# Patient Record
Sex: Male | Born: 1995 | Race: Black or African American | Hispanic: No | Marital: Single | Smoking: Never smoker
Health system: Southern US, Community
[De-identification: ages and names within clinical notes are randomized; demographics above are authoritative.]

## PROBLEM LIST (undated history)

## (undated) DIAGNOSIS — A048 Other specified bacterial intestinal infections: Secondary | ICD-10-CM

## (undated) DIAGNOSIS — K2970 Gastritis, unspecified, without bleeding: Secondary | ICD-10-CM

## (undated) DIAGNOSIS — R1110 Vomiting, unspecified: Secondary | ICD-10-CM

## (undated) DIAGNOSIS — R197 Diarrhea, unspecified: Secondary | ICD-10-CM

## (undated) HISTORY — DX: Gastritis, unspecified, without bleeding: K29.70

## (undated) HISTORY — DX: Other specified bacterial intestinal infections: A04.8

## (undated) HISTORY — DX: Diarrhea, unspecified: R19.7

## (undated) HISTORY — DX: Vomiting, unspecified: R11.10

---
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
## 2019-04-09 ENCOUNTER — Encounter (HOSPITAL_COMMUNITY): Payer: Self-pay | Admitting: Emergency Medicine

## 2019-04-09 ENCOUNTER — Emergency Department (HOSPITAL_COMMUNITY)
Admission: EM | Admit: 2019-04-09 | Discharge: 2019-04-09 | Disposition: A | Discharge status: Home / Self Care | Payer: BC Managed Care – PPO | Attending: Emergency Medicine | Admitting: Emergency Medicine

## 2019-04-09 ENCOUNTER — Other Ambulatory Visit: Payer: Self-pay

## 2019-04-09 ENCOUNTER — Emergency Department (HOSPITAL_COMMUNITY): Payer: BC Managed Care – PPO

## 2019-04-09 DIAGNOSIS — S8991XA Unspecified injury of right lower leg, initial encounter: Secondary | ICD-10-CM | POA: Diagnosis present

## 2019-04-09 DIAGNOSIS — Y999 Unspecified external cause status: Secondary | ICD-10-CM | POA: Insufficient documentation

## 2019-04-09 DIAGNOSIS — Y929 Unspecified place or not applicable: Secondary | ICD-10-CM | POA: Insufficient documentation

## 2019-04-09 DIAGNOSIS — Y9355 Activity, bike riding: Secondary | ICD-10-CM | POA: Insufficient documentation

## 2019-04-09 MED ORDER — KETOROLAC TROMETHAMINE 30 MG/ML IJ SOLN
60.0000 mg | Freq: Once | INTRAMUSCULAR | Status: AC
  Administered 2019-04-09: 60 mg via INTRAMUSCULAR
  Filled 2019-04-09: qty 2

## 2019-04-09 MED ORDER — ACETAMINOPHEN 500 MG PO TABS
1000.0000 mg | ORAL_TABLET | Freq: Once | ORAL | Status: AC
  Administered 2019-04-09: 08:00:00 1000 mg via ORAL
  Filled 2019-04-09: qty 2

## 2019-04-09 NOTE — Discharge Instructions (Signed)
You are seen in the ER for right knee injury  X-ray was normal  I suspect you may have sustained a soft tissue injury possibly involving your ligaments, tendons or meniscus  Initial treatment includes rest, ice, elevation, anti-inflammatories and immobilization  For pain and inflammation you can use a combination of ibuprofen and acetaminophen.  Take [phone] mg acetaminophen (tylenol) every 6 hours or 600 mg ibuprofen (advil, motrin) every 6 hours.  You can take these separately or combine them every 6 hours for maximum pain control. Do not exceed 4,000 mg acetaminophen or 2,400 mg ibuprofen in a 24 hour period.  Do not take ibuprofen containing products if you have history of kidney disease, ulcers, GI bleeding, severe acid reflux, or take a blood thinner.  Do not take acetaminophen if you have liver disease.   Wear the knee immobilizer and crutches for the next 7 to 10 days and do not weight-bear on this extremity.  Continue to do this until you are evaluated again by an orthopedist.  On your visit they may discuss other options for imaging including repeat x-rays, CT scans or MRIs to evaluate soft tissue injury.  Elevate your knee, ice at least 3-4 times a day for 20 minutes each time.  Return to the ER for worsening, severe pain redness swelling warmth fevers, calf pain or swelling, purple discoloration or coolness or loss of sensation to your extremity or foot
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
## 2019-04-09 NOTE — Progress Notes (Signed)
Orthopedic Tech Progress Note Patient Details:  Jonathan Gilbert 03-15-1996 [id number]  Ortho Devices Type of Ortho Device: Knee Immobilizer, Crutches Ortho Device/Splint Interventions: Application   Post Interventions Patient Tolerated: Well Instructions Provided: Care of device   [name] 04/09/2019, 8:34 AM

## 2020-06-11 ENCOUNTER — Encounter (HOSPITAL_COMMUNITY): Payer: Self-pay

## 2020-06-11 ENCOUNTER — Other Ambulatory Visit: Payer: Self-pay

## 2020-06-11 ENCOUNTER — Emergency Department (HOSPITAL_COMMUNITY)
Admission: EM | Admit: 2020-06-11 | Discharge: 2020-06-12 | Disposition: A | Discharge status: Home / Self Care | Payer: BC Managed Care – PPO | Attending: Emergency Medicine | Admitting: Emergency Medicine

## 2020-06-11 DIAGNOSIS — H9209 Otalgia, unspecified ear: Secondary | ICD-10-CM | POA: Diagnosis not present

## 2020-06-11 DIAGNOSIS — J029 Acute pharyngitis, unspecified: Secondary | ICD-10-CM | POA: Diagnosis present

## 2020-06-11 DIAGNOSIS — J020 Streptococcal pharyngitis: Secondary | ICD-10-CM | POA: Diagnosis not present

## 2020-06-11 NOTE — ED Triage Notes (Signed)
Patient with L ear pain and sore throat with swollen tonsils, feels like his L neck is swollen, denies fever

## 2020-06-12 LAB — GROUP A STREP BY PCR: Group A Strep by PCR: DETECTED — AB

## 2020-06-12 MED ORDER — DEXAMETHASONE 4 MG PO TABS
10.0000 mg | ORAL_TABLET | Freq: Once | ORAL | Status: AC
  Administered 2020-06-12: 10 mg via ORAL
  Filled 2020-06-12: qty 3

## 2020-06-12 MED ORDER — PENICILLIN G BENZATHINE 1200000 UNIT/2ML IM SUSP
1.2000 10*6.[IU] | Freq: Once | INTRAMUSCULAR | Status: AC
  Administered 2020-06-12: 1.2 10*6.[IU] via INTRAMUSCULAR
  Filled 2020-06-12: qty 2

## 2020-06-12 NOTE — Discharge Instructions (Signed)
We recommend naproxen for pain as well as over-the-counter Chloraseptic spray.  You may have soothing of your throat with drinking warm fluids such as tea with lemon and honey.  You were given an antibiotic prior to discharge and do not require any additional antibiotics for management.  We would recommend follow-up with a primary care doctor to ensure that symptoms are resolving.  Return for new or concerning symptoms.

## 2020-06-12 NOTE — ED Provider Notes (Signed)
[facility] EMERGENCY DEPARTMENT Provider Note   CSN: [id number] Arrival date & time: 06/11/20  2254     History Chief Complaint  Patient presents with  . Sore Throat  . Ear Pain    Jonathan Gilbert is a 25 y.o. male.  The history is provided by the patient. No language interpreter was used.  Sore Throat This is a new problem. Episode onset: 3-4 days ago. The problem occurs constantly. The problem has been gradually worsening. The symptoms are aggravated by swallowing. Nothing relieves the symptoms. The treatment provided no relief.       Past Medical History:  Diagnosis Date  . Diarrhea   . Gastritis   . H. pylori infection    Has History of H Pylori  . Vomiting     Patient Active Problem List   Diagnosis Date Noted  . Gastritis   . Diarrhea   . Vomiting   . H. pylori infection     History reviewed. No pertinent surgical history.     Family History  Problem Relation Age of Onset  . Ulcers Father     Social History   Tobacco Use  . Smoking status: Never Smoker  . Smokeless tobacco: Never Used  Substance Use Topics  . Alcohol use: Yes    Home Medications Prior to Admission medications   Medication Sig Start Date End Date Taking? Authorizing Provider  desloratadine (CLARINEX) 5 MG tablet Take 5 mg by mouth daily.   07/18/10   [provider]  esomeprazole (NEXIUM) 40 MG capsule Take 40 mg by mouth daily before breakfast.   07/18/10   [provider]    Allergies    Patient has no known allergies.  Review of Systems   Review of Systems  Ten systems reviewed and are negative for acute change, except as noted in the HPI.    Physical Exam Updated Vital Signs BP (!) 143/93 (BP Location: Left Arm)   Pulse 72   Temp 98.5 F (36.9 C) (Oral)   Resp 16   Ht 6\' 2"  (1.88 m)   Wt 124.7 kg   SpO2 98%   BMI 35.31 kg/m   Physical Exam Vitals and nursing note reviewed.  Constitutional:      General: He is not in  acute distress.    Appearance: He is well-developed. He is not diaphoretic.     Comments: Nontoxic appearing and in NAD  HENT:     Head: Normocephalic and atraumatic.     Mouth/Throat:     Mouth: Mucous membranes are moist.     Pharynx: Uvula midline. Posterior oropharyngeal erythema present.     Tonsils: No tonsillar abscesses.  Eyes:     General: No scleral icterus.    Conjunctiva/sclera: Conjunctivae normal.  Neck:     Comments: No meningismus Pulmonary:     Effort: Pulmonary effort is normal. No respiratory distress.     Comments: Respirations even and unlabored. No stridor. Musculoskeletal:        General: Normal range of motion.     Cervical back: Normal range of motion.  Lymphadenopathy:     Cervical: Cervical adenopathy present.  Skin:    General: Skin is warm and dry.     Coloration: Skin is not pale.     Findings: No erythema or rash.  Neurological:     Mental Status: He is alert and oriented to person, place, and time.     Coordination: Coordination normal.  Psychiatric:  Behavior: Behavior normal.     ED Results / Procedures / Treatments   Labs (all labs ordered are listed, but only abnormal results are displayed) Labs Reviewed  GROUP A STREP BY PCR - Abnormal; Notable for the following components:      Result Value   Group A Strep by PCR DETECTED (*)    All other components within normal limits    EKG None  Radiology No results found.  Procedures Procedures   Medications Ordered in ED Medications  penicillin g benzathine (BICILLIN LA) 1200000 UNIT/2ML injection 1.2 Million Units (1.2 Million Units Intramuscular Given 06/12/20 0408)  dexamethasone (DECADRON) tablet 10 mg (10 mg Oral Given 06/12/20 0407)    ED Course  I have reviewed the triage vital signs and the nursing notes.  Pertinent labs & imaging results that were available during my care of the patient were reviewed by me and considered in my medical decision making (see chart for  details).    MDM Rules/Calculators/A&P                          Patient with posterior oropharyngeal erythema, cervical lymphadenopathy, and dysphagia; diagnosis of strep. Treated in the ED with steroids and PCN IM. Presentation not concerning for PTA or infxn spread to soft tissue. No trismus or uvula deviation.  Tolerating secretions without difficulty. Return precautions discussed and provided. Patient discharged in stable condition with no unaddressed concerns.   Final Clinical Impression(s) / ED Diagnoses Final diagnoses:  Strep pharyngitis    Rx / DC Orders ED Discharge Orders    None       Antony Madura, PA-C 06/12/20 0419    Glynn Octave, MD 06/12/20 [phone]

## 2021-03-06 IMAGING — CR DG KNEE COMPLETE 4+V*R*
4 series · 4 of 4 positions shown · non-contrast
Comparison: April 20, 2003.

CLINICAL DATA: Right knee pain after fall yesterday.

EXAM:
RIGHT KNEE - COMPLETE 4+ VIEW

[knee ap]
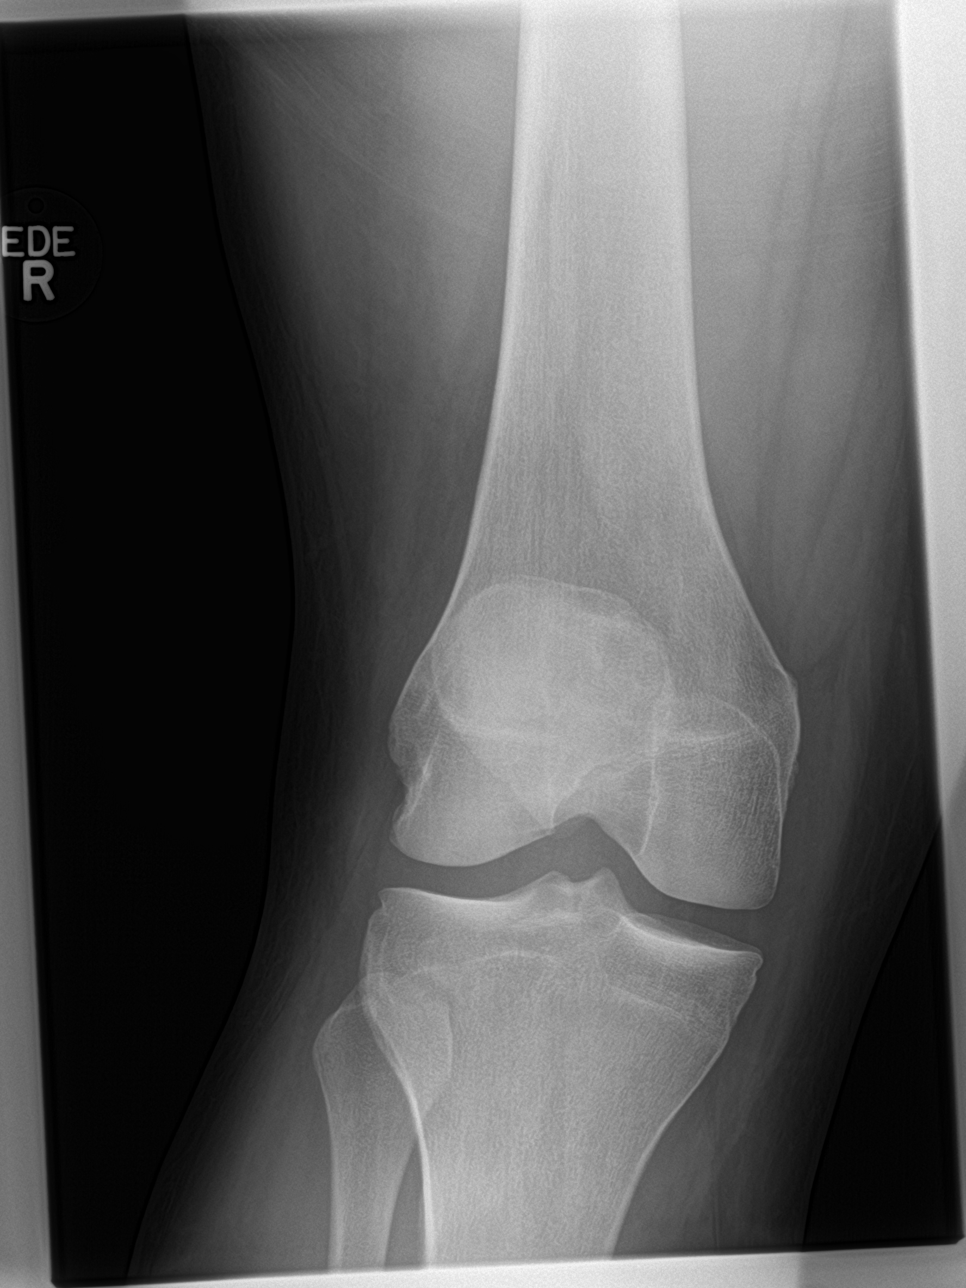

[knee lat]
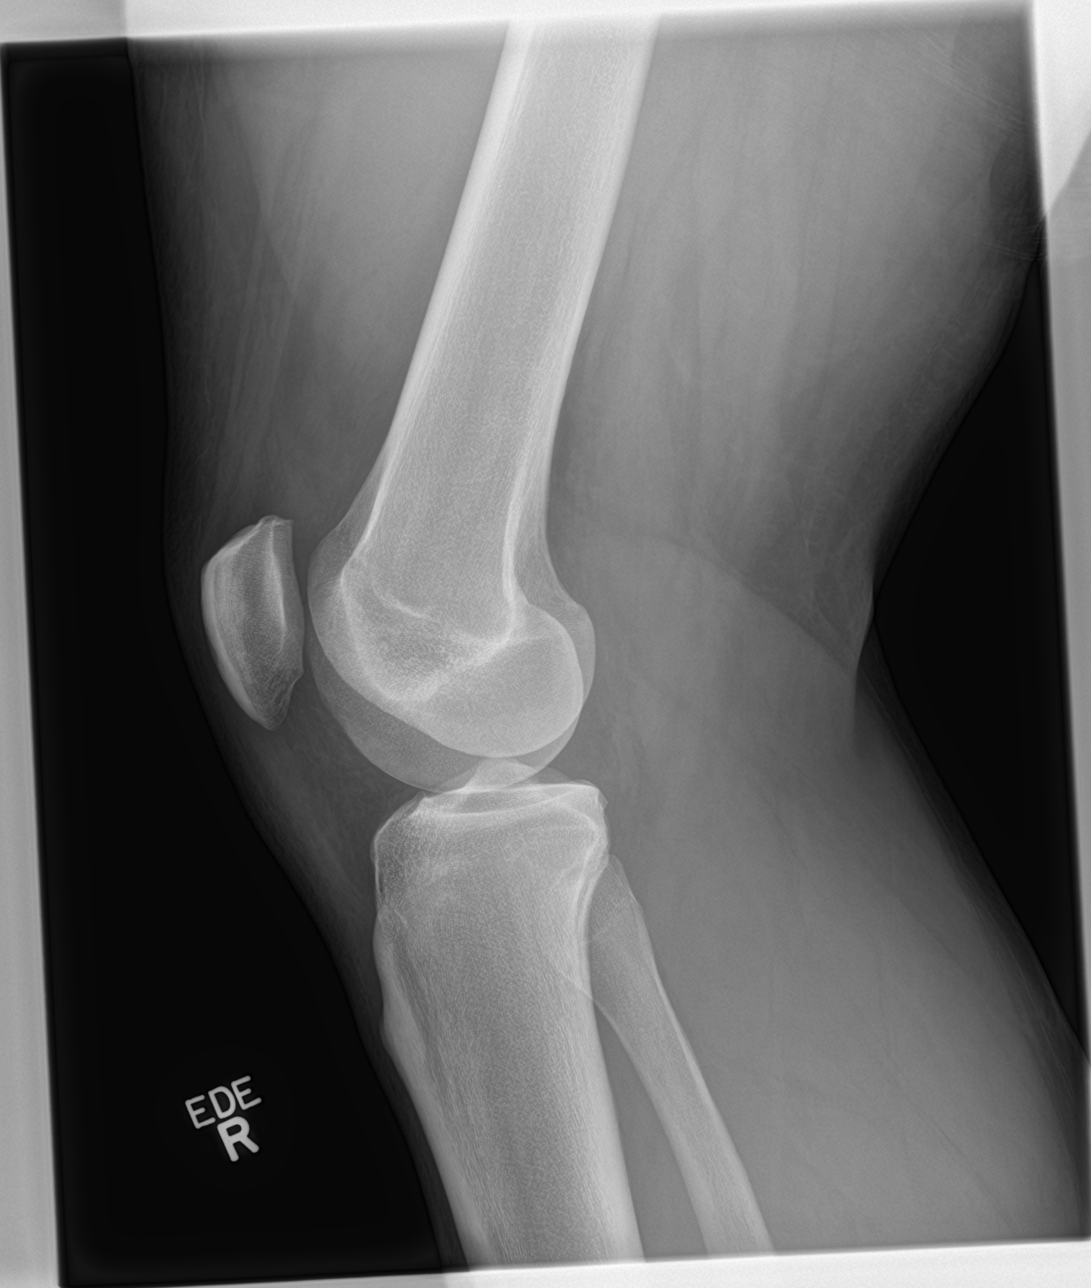

[knee obl (1 of 2)]
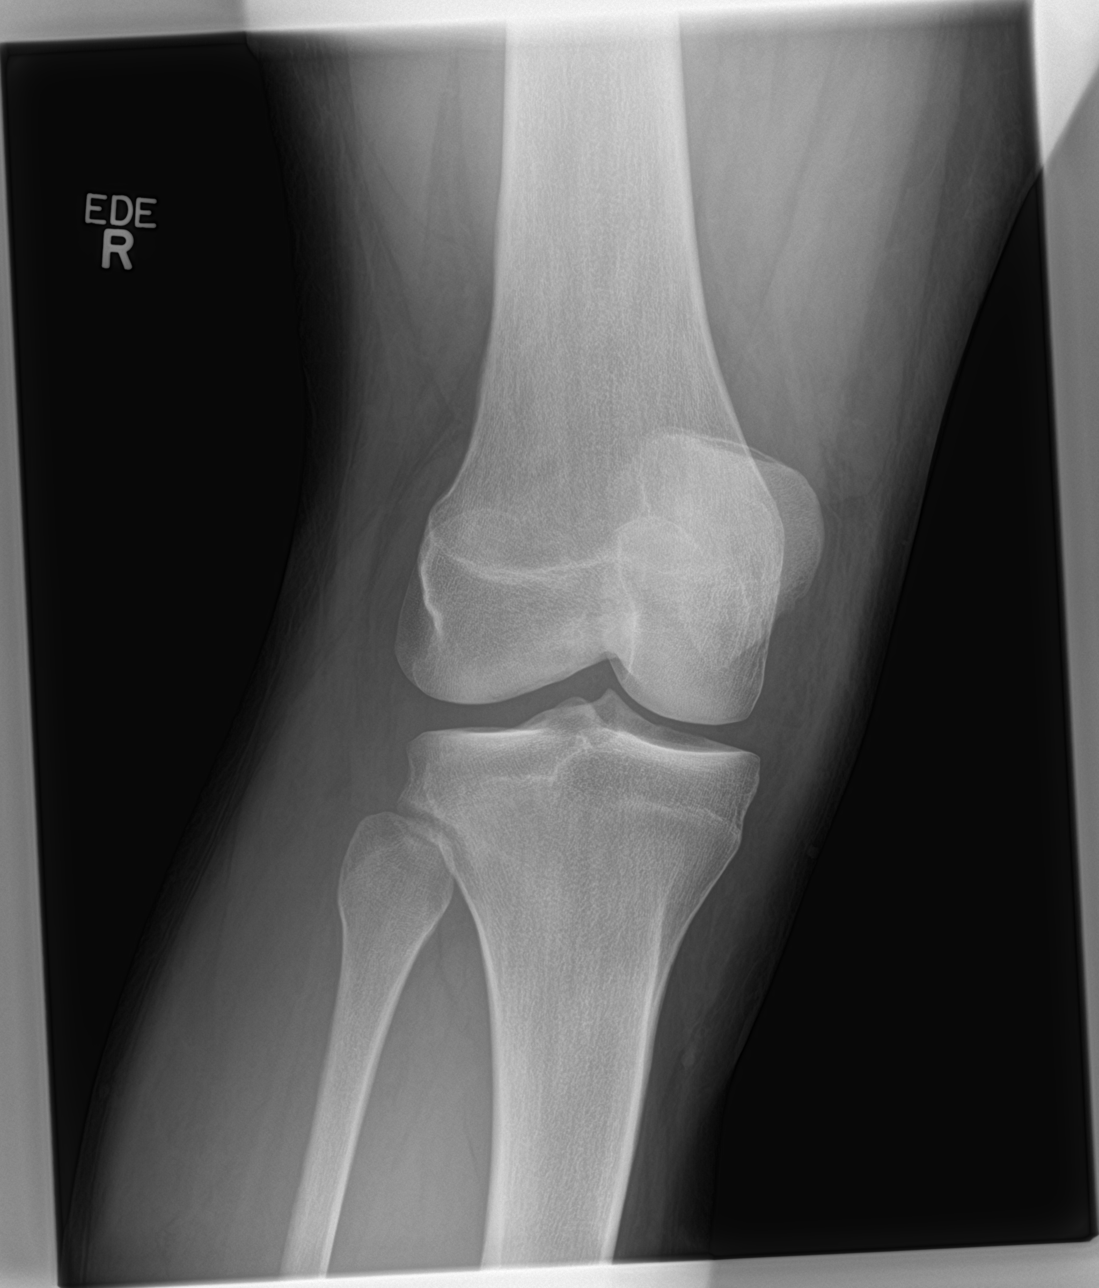

[knee obl (2 of 2)]
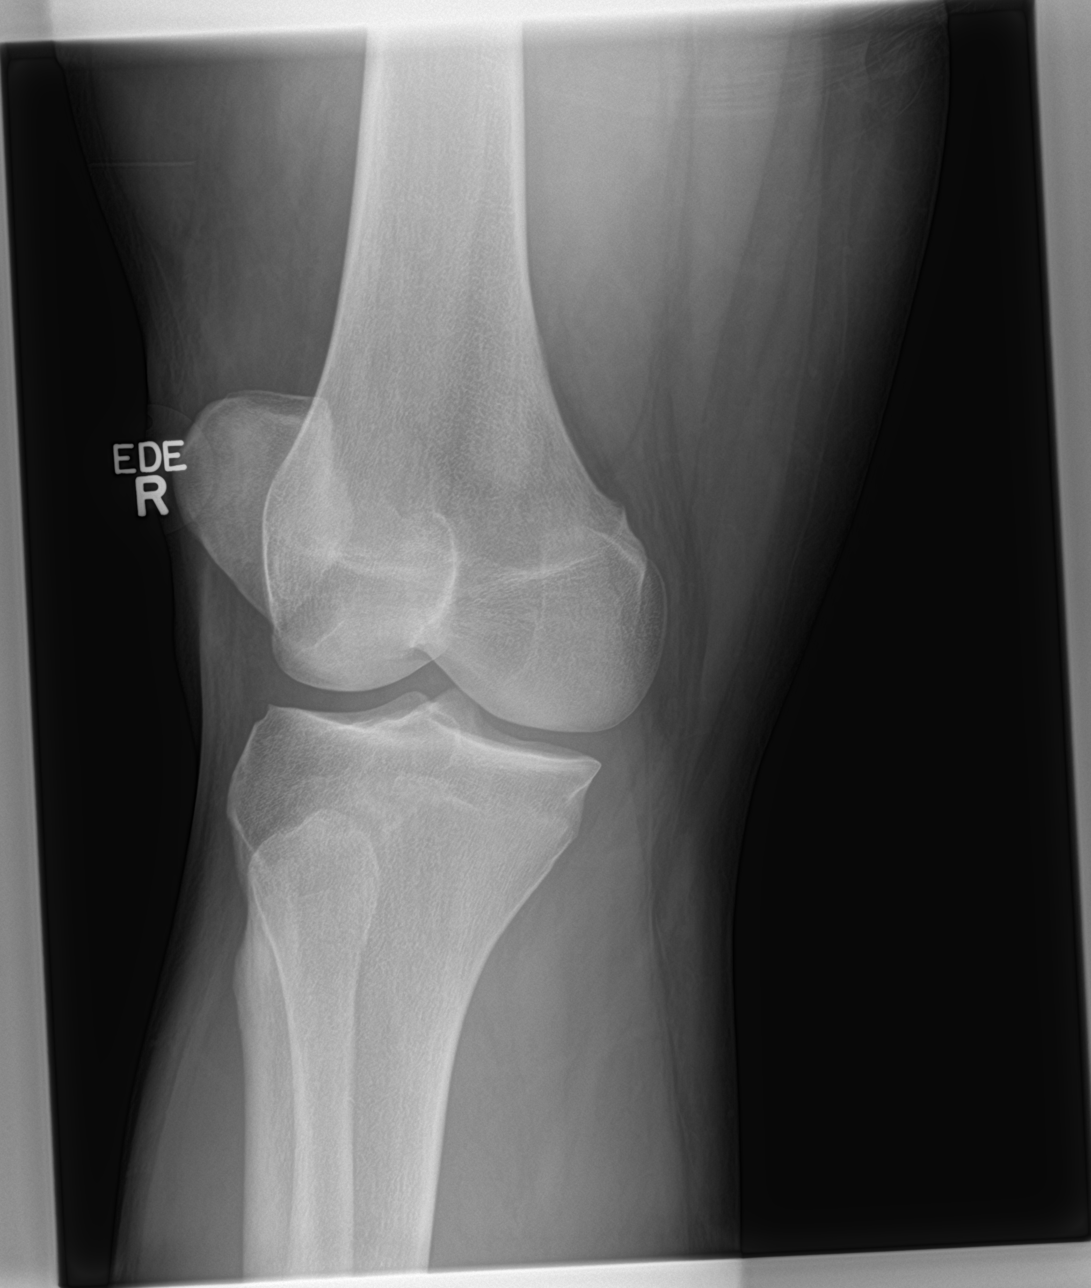

[4 of 4 positions shown; findings below may reference images not displayed]

FINDINGS: No evidence of fracture, dislocation, or joint effusion. No evidence
of arthropathy or other focal bone abnormality. Soft tissues are
unremarkable.
IMPRESSION: Negative.

## 2022-01-23 ENCOUNTER — Other Ambulatory Visit: Payer: Self-pay

## 2022-01-23 ENCOUNTER — Emergency Department (HOSPITAL_BASED_OUTPATIENT_CLINIC_OR_DEPARTMENT_OTHER)
Admission: EM | Admit: 2022-01-23 | Discharge: 2022-01-23 | Disposition: A | Discharge status: Home / Self Care | Payer: 59 | Attending: Emergency Medicine | Admitting: Emergency Medicine

## 2022-01-23 ENCOUNTER — Encounter (HOSPITAL_BASED_OUTPATIENT_CLINIC_OR_DEPARTMENT_OTHER): Payer: Self-pay

## 2022-01-23 DIAGNOSIS — X58XXXA Exposure to other specified factors, initial encounter: Secondary | ICD-10-CM | POA: Insufficient documentation

## 2022-01-23 DIAGNOSIS — S39012A Strain of muscle, fascia and tendon of lower back, initial encounter: Secondary | ICD-10-CM | POA: Insufficient documentation

## 2022-01-23 DIAGNOSIS — S3992XA Unspecified injury of lower back, initial encounter: Secondary | ICD-10-CM | POA: Diagnosis present

## 2022-01-23 MED ORDER — CYCLOBENZAPRINE HCL 10 MG PO TABS: 10.0000 mg | ORAL_TABLET | Freq: Two times a day (BID) | ORAL | 0 refills | Status: DC | PRN

## 2022-01-23 MED ORDER — IBUPROFEN 600 MG PO TABS: 600.0000 mg | ORAL_TABLET | Freq: Four times a day (QID) | ORAL | 0 refills | Status: DC | PRN

## 2022-01-23 NOTE — ED Triage Notes (Signed)
Back pain started 2-3 days ago mid to lower back pain. Took ibuprofen 800 extra strength. New onset.

## 2022-01-23 NOTE — ED Provider Notes (Signed)
[facility] EMERGENCY DEPT Provider Note   CSN: [id number] Arrival date & time: 01/23/22  1214     History  Chief Complaint  Patient presents with   Back Pain    Jonathan Gilbert is a 26 y.o. male.  The history is provided by the patient. No language interpreter was used.  Back Pain    26 year old male here with complaint of back pain.  Patient report dull achy left-sided lower back pain for the last 2 days.  Pain worse when he flexes back and improves when he extends back.  Pain is moderate in severity and improved with IcyHot and rest.  No fever or chills no abdominal pain no nausea vomiting diarrhea no dysuria no hematuria no rash.  Denies any strenuous activities or heavy lifting.  No trouble ambulating.  Home Medications Prior to Admission medications   Medication Sig Start Date End Date Taking? Authorizing Provider  desloratadine (CLARINEX) 5 MG tablet Take 5 mg by mouth daily.   07/18/10   [provider]  esomeprazole (NEXIUM) 40 MG capsule Take 40 mg by mouth daily before breakfast.   07/18/10   [provider]      Allergies    Patient has no known allergies.    Review of Systems   Review of Systems  Musculoskeletal:  Positive for back pain.  All other systems reviewed and are negative.   Physical Exam Updated Vital Signs BP (!) 135/100 (BP Location: Right Arm)   Pulse 60   Temp 98.2 F (36.8 C) (Oral)   Resp 19   Ht 6\' 2"  (1.88 m)   Wt 122.5 kg   SpO2 97%   BMI 34.67 kg/m  Physical Exam Vitals and nursing note reviewed.  Constitutional:      General: He is not in acute distress.    Appearance: He is well-developed.  HENT:     Head: Atraumatic.  Eyes:     Conjunctiva/sclera: Conjunctivae normal.  Abdominal:     Palpations: Abdomen is soft.     Tenderness: There is no abdominal tenderness.  Musculoskeletal:        General: Tenderness (Mild tenderness to left lumbar paraspinal muscle without any overlying skin  changes.  No CVA tenderness.  Abdomen is soft nontender.) present.     Cervical back: Neck supple.     Comments: 5 out of 5 strength to bilateral lower extremities with intact distal pedal pulse  Skin:    Findings: No rash.  Neurological:     Mental Status: He is alert.     ED Results / Procedures / Treatments   Labs (all labs ordered are listed, but only abnormal results are displayed) Labs Reviewed - No data to display  EKG None  Radiology No results found.  Procedures Procedures    Medications Ordered in ED Medications - No data to display  ED Course/ Medical Decision Making/ A&P                           Medical Decision Making  BP (!) 135/100 (BP Location: Right Arm)   Pulse 60   Temp 98.2 F (36.8 C) (Oral)   Resp 19   Ht 6\' 2"  (1.88 m)   Wt 122.5 kg   SpO2 97%   BMI 34.67 kg/m   72:75 PM 26 year old male here with complaint of back pain.  Patient report dull achy left-sided lower back pain for  the last 2 days.  Pain worse when he flexes back and improves when he extends back.  Pain is moderate in severity and improved with IcyHot and rest.  No fever or chills no abdominal pain no nausea vomiting diarrhea no dysuria no hematuria no rash.  Denies any strenuous activities or heavy lifting.  No trouble ambulating.  On exam this is a well-appearing male appears to be in no acute discomfort.  He does have some mild tenderness to his left lumbar paraspinal muscle without any overlying skin changes.  He does not have any CVA tenderness to suggest kidney stone.  Abdomen is soft nontender.  Has equal strength to bilateral lower extremities.  He ambulate without difficulty.  I have considered x-ray of lumbar spine but felt it is low yield given lack of injury and no pain to midline spine.  I have considered labs and CT scan but I felt symptoms more likely MSK and he has a fairly benign abdominal exam.  I have consider prescription medication, patient comfortable with  ibuprofen and Flexeril.  Return precaution given.  Differential diagnosis include muscle strain, spinal stenosis, kidney stone, UTI, shingles, dissection.           Final Clinical Impression(s) / ED Diagnoses Final diagnoses:  Strain of lumbar paraspinous muscle, initial encounter    Rx / DC Orders ED Discharge Orders          Ordered    ibuprofen (ADVIL) 600 MG tablet  Every 6 hours PRN        01/23/22 1523    cyclobenzaprine (FLEXERIL) 10 MG tablet  2 times daily PRN        01/23/22 1523              Domenic Moras, PA-C 01/23/22 1525    Ezequiel Essex, MD 01/23/22 1955

## 2023-01-31 ENCOUNTER — Encounter (HOSPITAL_COMMUNITY): Payer: Self-pay | Admitting: Radiology

## 2023-01-31 ENCOUNTER — Other Ambulatory Visit: Payer: Self-pay

## 2023-01-31 ENCOUNTER — Inpatient Hospital Stay (HOSPITAL_COMMUNITY)
Admission: EM | Admit: 2023-01-31 | Discharge: 2023-02-04 | DRG: 176 | Disposition: A | Discharge status: Home / Self Care | Payer: Self-pay | Attending: Internal Medicine | Admitting: Internal Medicine

## 2023-01-31 ENCOUNTER — Emergency Department (HOSPITAL_COMMUNITY): Payer: Self-pay

## 2023-01-31 DIAGNOSIS — I2699 Other pulmonary embolism without acute cor pulmonale: Principal | ICD-10-CM | POA: Diagnosis present

## 2023-01-31 DIAGNOSIS — K2970 Gastritis, unspecified, without bleeding: Secondary | ICD-10-CM | POA: Diagnosis present

## 2023-01-31 DIAGNOSIS — E66812 Obesity, class 2: Secondary | ICD-10-CM | POA: Diagnosis present

## 2023-01-31 DIAGNOSIS — Z79899 Other long term (current) drug therapy: Secondary | ICD-10-CM

## 2023-01-31 DIAGNOSIS — F1290 Cannabis use, unspecified, uncomplicated: Secondary | ICD-10-CM | POA: Diagnosis present

## 2023-01-31 DIAGNOSIS — Z86718 Personal history of other venous thrombosis and embolism: Secondary | ICD-10-CM

## 2023-01-31 DIAGNOSIS — K219 Gastro-esophageal reflux disease without esophagitis: Secondary | ICD-10-CM | POA: Diagnosis present

## 2023-01-31 DIAGNOSIS — I2694 Multiple subsegmental pulmonary emboli without acute cor pulmonale: Principal | ICD-10-CM | POA: Diagnosis present

## 2023-01-31 DIAGNOSIS — Z7151 Drug abuse counseling and surveillance of drug abuser: Secondary | ICD-10-CM

## 2023-01-31 DIAGNOSIS — Z713 Dietary counseling and surveillance: Secondary | ICD-10-CM

## 2023-01-31 DIAGNOSIS — Z6835 Body mass index (BMI) 35.0-35.9, adult: Secondary | ICD-10-CM

## 2023-01-31 LAB — CBC
HCT: 46.1 % (ref 39.0–52.0)
Hemoglobin: 15.7 g/dL (ref 13.0–17.0)
MCH: 31.2 pg (ref 26.0–34.0)
MCHC: 34.1 g/dL (ref 30.0–36.0)
MCV: 91.5 fL (ref 80.0–100.0)
Platelets: 189 10*3/uL (ref 150–400)
RBC: 5.04 MIL/uL (ref 4.22–5.81)
RDW: 12.1 % (ref 11.5–15.5)
WBC: 10.7 10*3/uL — ABNORMAL HIGH (ref 4.0–10.5)
nRBC: 0 % (ref 0.0–0.2)

## 2023-01-31 LAB — BASIC METABOLIC PANEL
Anion gap: 11 (ref 5–15)
BUN: 9 mg/dL (ref 6–20)
CO2: 23 mmol/L (ref 22–32)
Calcium: 9.1 mg/dL (ref 8.9–10.3)
Chloride: 103 mmol/L (ref 98–111)
Creatinine, Ser: 1.05 mg/dL (ref 0.61–1.24)
GFR, Estimated: >60 mL/min (ref >60)
Glucose, Bld: 98 mg/dL (ref 70–99)
Potassium: 4.1 mmol/L (ref 3.5–5.1)
Sodium: 137 mmol/L (ref 135–145)

## 2023-01-31 LAB — TROPONIN I (HIGH SENSITIVITY): Troponin I (High Sensitivity): 2 ng/L (ref <18)

## 2023-01-31 NOTE — ED Triage Notes (Signed)
Pt states chest pain x2 days in the left chest then around to his back and today up his neck and down arm today. Pain is reproducible.  91 140/90 98%

## 2023-02-01 ENCOUNTER — Observation Stay (HOSPITAL_COMMUNITY): Payer: Self-pay

## 2023-02-01 ENCOUNTER — Inpatient Hospital Stay (HOSPITAL_COMMUNITY): Payer: Self-pay

## 2023-02-01 ENCOUNTER — Other Ambulatory Visit (HOSPITAL_COMMUNITY): Payer: Self-pay

## 2023-02-01 ENCOUNTER — Emergency Department (HOSPITAL_COMMUNITY): Payer: Self-pay

## 2023-02-01 ENCOUNTER — Other Ambulatory Visit (HOSPITAL_COMMUNITY): Payer: Self-pay | Admitting: *Deleted

## 2023-02-01 DIAGNOSIS — E66812 Obesity, class 2: Secondary | ICD-10-CM | POA: Diagnosis present

## 2023-02-01 DIAGNOSIS — I2609 Other pulmonary embolism with acute cor pulmonale: Secondary | ICD-10-CM

## 2023-02-01 DIAGNOSIS — K2950 Unspecified chronic gastritis without bleeding: Secondary | ICD-10-CM

## 2023-02-01 DIAGNOSIS — I2699 Other pulmonary embolism without acute cor pulmonale: Secondary | ICD-10-CM | POA: Diagnosis present

## 2023-02-01 LAB — CBC
HCT: 40.3 % (ref 39.0–52.0)
Hemoglobin: 14.3 g/dL (ref 13.0–17.0)
MCH: 32.1 pg (ref 26.0–34.0)
MCHC: 35.5 g/dL (ref 30.0–36.0)
MCV: 90.6 fL (ref 80.0–100.0)
Platelets: 161 10*3/uL (ref 150–400)
RBC: 4.45 MIL/uL (ref 4.22–5.81)
RDW: 12 % (ref 11.5–15.5)
WBC: 11.1 10*3/uL — ABNORMAL HIGH (ref 4.0–10.5)
nRBC: 0 % (ref 0.0–0.2)

## 2023-02-01 LAB — ECHOCARDIOGRAM COMPLETE
AR max vel: 2.97 cm2
AV Area VTI: 3.19 cm2
AV Area mean vel: 3.31 cm2
AV Mean grad: 6 mm[Hg]
AV Peak grad: 12 mm[Hg]
Ao pk vel: 1.73 m/s
Area-P 1/2: 3.31 cm2
Height: 74 in
S' Lateral: 3.1 cm
Weight: 4480.1 [oz_av]

## 2023-02-01 LAB — HEPATIC FUNCTION PANEL
ALT: 185 U/L — ABNORMAL HIGH (ref 0–44)
AST: 414 U/L — ABNORMAL HIGH (ref 15–41)
Albumin: 4.4 g/dL (ref 3.5–5.0)
Alkaline Phosphatase: 88 U/L (ref 38–126)
Bilirubin, Direct: 0.7 mg/dL — ABNORMAL HIGH (ref 0.0–0.2)
Indirect Bilirubin: 1.6 mg/dL — ABNORMAL HIGH (ref 0.3–0.9)
Total Bilirubin: 2.3 mg/dL — ABNORMAL HIGH (ref <1.2)
Total Protein: 7.6 g/dL (ref 6.5–8.1)

## 2023-02-01 LAB — HIV ANTIBODY (ROUTINE TESTING W REFLEX): HIV Screen 4th Generation wRfx: NONREACTIVE

## 2023-02-01 LAB — HEPARIN LEVEL (UNFRACTIONATED)
Heparin Unfractionated: <0.1 [IU]/mL — ABNORMAL LOW (ref 0.30–0.70)
Heparin Unfractionated: 0.81 [IU]/mL — ABNORMAL HIGH (ref 0.30–0.70)

## 2023-02-01 LAB — MAGNESIUM: Magnesium: 2.1 mg/dL (ref 1.7–2.4)

## 2023-02-01 LAB — TROPONIN I (HIGH SENSITIVITY): Troponin I (High Sensitivity): 2 ng/L (ref <18)

## 2023-02-01 LAB — TSH: TSH: 2.005 u[IU]/mL (ref 0.350–4.500)

## 2023-02-01 MED ORDER — ACETAMINOPHEN 650 MG RE SUPP: 650.0000 mg | Freq: Four times a day (QID) | RECTAL | Status: DC | PRN

## 2023-02-01 MED ORDER — POLYETHYLENE GLYCOL 3350 17 G PO PACK: 17.0000 g | PACK | Freq: Every day | ORAL | Status: DC | PRN

## 2023-02-01 MED ORDER — HYDROMORPHONE HCL 1 MG/ML IJ SOLN
1.0000 mg | INTRAMUSCULAR | Status: DC | PRN
  Administered 2023-02-01: 1 mg via INTRAVENOUS
  Filled 2023-02-01: qty 1

## 2023-02-01 MED ORDER — OXYCODONE HCL 5 MG PO TABS
5.0000 mg | ORAL_TABLET | Freq: Four times a day (QID) | ORAL | Status: DC | PRN
  Administered 2023-02-01 – 2023-02-03 (×2): 5 mg via ORAL
  Filled 2023-02-01 (×2): qty 1

## 2023-02-01 MED ORDER — ONDANSETRON HCL 4 MG/2ML IJ SOLN
4.0000 mg | Freq: Once | INTRAMUSCULAR | Status: AC
Start: 2023-02-01 — End: 2023-02-01
  Administered 2023-02-01: 4 mg via INTRAVENOUS
  Filled 2023-02-01: qty 2

## 2023-02-01 MED ORDER — HYDROMORPHONE HCL 1 MG/ML IJ SOLN
INTRAMUSCULAR | Status: AC
  Administered 2023-02-01: 1 mg via INTRAVENOUS
  Filled 2023-02-01: qty 1

## 2023-02-01 MED ORDER — HEPARIN BOLUS VIA INFUSION
3000.0000 [IU] | Freq: Once | INTRAVENOUS | Status: AC
  Administered 2023-02-01: 3000 [IU] via INTRAVENOUS

## 2023-02-01 MED ORDER — NICOTINE 21 MG/24HR TD PT24: 21.0000 mg | MEDICATED_PATCH | Freq: Every day | TRANSDERMAL | Status: DC

## 2023-02-01 MED ORDER — SODIUM CHLORIDE 0.9 % IV BOLUS
1000.0000 mL | Freq: Once | INTRAVENOUS | Status: AC
Start: 2023-02-01 — End: 2023-02-01
  Administered 2023-02-01: 1000 mL via INTRAVENOUS

## 2023-02-01 MED ORDER — ACETAMINOPHEN 325 MG PO TABS
650.0000 mg | ORAL_TABLET | Freq: Four times a day (QID) | ORAL | Status: DC | PRN
  Administered 2023-02-01 – 2023-02-03 (×3): 650 mg via ORAL
  Filled 2023-02-01 (×3): qty 2

## 2023-02-01 MED ORDER — HEPARIN (PORCINE) 25000 UT/250ML-% IV SOLN
2000.0000 [IU]/h | INTRAVENOUS | Status: DC
  Administered 2023-02-01: 1800 [IU]/h via INTRAVENOUS
  Administered 2023-02-02: 2000 [IU]/h via INTRAVENOUS
  Filled 2023-02-01 (×3): qty 250

## 2023-02-01 MED ORDER — MORPHINE SULFATE (PF) 4 MG/ML IV SOLN
4.0000 mg | Freq: Once | INTRAVENOUS | Status: AC
Start: 2023-02-01 — End: 2023-02-01
  Administered 2023-02-01: 4 mg via INTRAVENOUS
  Filled 2023-02-01: qty 1

## 2023-02-01 MED ORDER — PANTOPRAZOLE SODIUM 40 MG PO TBEC
40.0000 mg | DELAYED_RELEASE_TABLET | Freq: Every day | ORAL | Status: DC
  Administered 2023-02-01 – 2023-02-04 (×4): 40 mg via ORAL
  Filled 2023-02-01 (×4): qty 1

## 2023-02-01 MED ORDER — ONDANSETRON HCL 4 MG/2ML IJ SOLN: 4.0000 mg | Freq: Four times a day (QID) | INTRAMUSCULAR | Status: DC | PRN

## 2023-02-01 MED ORDER — ONDANSETRON HCL 4 MG PO TABS: 4.0000 mg | ORAL_TABLET | Freq: Four times a day (QID) | ORAL | Status: DC | PRN

## 2023-02-01 MED ORDER — KETOROLAC TROMETHAMINE 30 MG/ML IJ SOLN
30.0000 mg | Freq: Once | INTRAMUSCULAR | Status: AC
Start: 2023-02-01 — End: 2023-02-01
  Administered 2023-02-01: 30 mg via INTRAVENOUS
  Filled 2023-02-01: qty 1

## 2023-02-01 MED ORDER — PERFLUTREN LIPID MICROSPHERE
1.0000 mL | INTRAVENOUS | Status: AC | PRN
  Administered 2023-02-01: 3 mL via INTRAVENOUS

## 2023-02-01 MED ORDER — ORAL CARE MOUTH RINSE: 15.0000 mL | OROMUCOSAL | Status: DC | PRN

## 2023-02-01 MED ORDER — HYDROMORPHONE HCL 1 MG/ML IJ SOLN
1.0000 mg | INTRAMUSCULAR | Status: DC | PRN
  Administered 2023-02-01 – 2023-02-04 (×14): 1 mg via INTRAVENOUS
  Filled 2023-02-01 (×15): qty 1

## 2023-02-01 MED ORDER — IOHEXOL 350 MG/ML SOLN
100.0000 mL | Freq: Once | INTRAVENOUS | Status: AC | PRN
  Administered 2023-02-01: 100 mL via INTRAVENOUS

## 2023-02-01 MED ORDER — HEPARIN BOLUS VIA INFUSION
6000.0000 [IU] | Freq: Once | INTRAVENOUS | Status: AC
  Administered 2023-02-01: 6000 [IU] via INTRAVENOUS

## 2023-02-01 NOTE — Plan of Care (Signed)
  Problem: Education: Goal: Knowledge of General Education information will improve Description: Including pain rating scale, medication(s)/side effects and non-pharmacologic comfort measures Outcome: Progressing   Problem: Activity: Goal: Risk for activity intolerance will decrease Outcome: Not Progressing   Problem: Nutrition: Goal: Adequate nutrition will be maintained Outcome: Progressing

## 2023-02-01 NOTE — Assessment & Plan Note (Signed)
-  Appears to be unprovoked event -Patient expressed no recent long travels, orthopedic surgery, immobility, intravenous drug use or COVID vaccination. -He reported some family history of blood clots -2D echo and lower extremity Doppler has been ordered -Planning 24-48 hours of IV heparin with subsequent transition to Eliquis. -As needed analgesics will be provided for pleuritic chest pain -Good saturation on room air and normal vital signs otherwise appreciated.

## 2023-02-01 NOTE — Progress Notes (Signed)
PHARMACY - ANTICOAGULATION CONSULT NOTE  Pharmacy Consult for heparin Indication: pulmonary embolus  No Known Allergies  Patient Measurements: Height: 6\' 2"  (188 cm) Weight: 127 kg (280 lb) IBW/kg (Calculated) : 82.2 Heparin Dosing Weight: 110 kg  Vital Signs: Temp: 100.1 F (37.8 C) (11/07 2025) Temp Source: Oral (11/07 2025) BP: 127/91 (11/08 0230) Pulse Rate: 76 (11/08 0230)  Labs: Recent Labs    01/31/23 2103 02/01/23 0002  HGB 15.7  --   HCT 46.1  --   PLT 189  --   CREATININE 1.05  --   TROPONINIHS 2 2    Estimated Creatinine Clearance: 150.9 mL/min (by C-G formula based on SCr of 1.05 mg/dL).   Medical History: Past Medical History:  Diagnosis Date   Diarrhea    Gastritis    H. pylori infection    Has History of H Pylori   Vomiting    Assessment: 54 yoM presented to [facility] ED with complaints of chest pain. Pharmacy consulted to dose heparin for pulmonary embolism.  -CTA shows bilateral segmental and subsegmental emboli, no RHS -Hgb 15.7, plts 189, no prior anticoagulation   Goal of Therapy:  Heparin level 0.3-0.7 units/ml Monitor platelets by anticoagulation protocol: Yes   Plan:  Give 6000 units bolus x 1 Start heparin infusion at 1800 units/hr Check anti-Xa level in 6 hours and daily while on heparin  Arabella Merles, PharmD. Clinical Pharmacist 02/01/2023 3:01 AM

## 2023-02-01 NOTE — ED Provider Notes (Signed)
[place] EMERGENCY DEPARTMENT AT [facility] Provider Note   CSN: [id number] Arrival date & time: 01/31/23  2020     History  Chief Complaint  Patient presents with   Chest Pain    Markael Hyder III is a 27 y.o. male.  Is a 27 year old male with history of GERD.  Patient presenting today for evaluation of chest pain.  This has been ongoing for the past 2 days.  He describes sharp pain starting from his clavicle and radiating into his left back.  This is worse when he moves.  He also describes pain with coughing.  No fevers or chills.  No productive cough.  No alleviating factors.  The history is provided by the patient.       Home Medications Prior to Admission medications   Medication Sig Start Date End Date Taking? Authorizing Provider  cyclobenzaprine (FLEXERIL) 10 MG tablet Take 1 tablet (10 mg total) by mouth 2 (two) times daily as needed for muscle spasms. 01/23/22   Fayrene Helper, PA-C  desloratadine (CLARINEX) 5 MG tablet Take 5 mg by mouth daily.   07/18/10   [provider]  esomeprazole (NEXIUM) 40 MG capsule Take 40 mg by mouth daily before breakfast.   07/18/10   [provider]  ibuprofen (ADVIL) 600 MG tablet Take 1 tablet (600 mg total) by mouth every 6 (six) hours as needed. 01/23/22   Fayrene Helper, PA-C      Allergies    Patient has no known allergies.    Review of Systems   Review of Systems  All other systems reviewed and are negative.   Physical Exam Updated Vital Signs BP (!) 150/96   Pulse 84   Temp 100.1 F (37.8 C) (Oral)   Resp 19   Ht 6\' 2"  (1.88 m)   Wt 127 kg   SpO2 98%   BMI 35.95 kg/m  Physical Exam Vitals and nursing note reviewed.  Constitutional:      General: He is not in acute distress.    Appearance: He is well-developed. He is not diaphoretic.  HENT:     Head: Normocephalic and atraumatic.  Cardiovascular:     Rate and Rhythm: Normal rate and regular rhythm.     Heart sounds: No murmur  heard.    No friction rub.  Pulmonary:     Effort: Pulmonary effort is normal. No respiratory distress.     Breath sounds: Normal breath sounds. No wheezing or rales.  Chest:     Chest wall: Tenderness present.     Comments: There is tenderness noted to the left upper chest and left lateral chest.  There is no crepitus. Abdominal:     General: Bowel sounds are normal. There is no distension.     Palpations: Abdomen is soft.     Tenderness: There is no abdominal tenderness.  Musculoskeletal:        General: Normal range of motion.     Cervical back: Normal range of motion and neck supple.  Skin:    General: Skin is warm and dry.  Neurological:     Mental Status: He is alert and oriented to person, place, and time.     Coordination: Coordination normal.     ED Results / Procedures / Treatments   Labs (all labs ordered are listed, but only abnormal results are displayed) Labs Reviewed  CBC - Abnormal; Notable for the following components:      Result Value   WBC  10.7 (*)    All other components within normal limits  BASIC METABOLIC PANEL  TROPONIN I (HIGH SENSITIVITY)  TROPONIN I (HIGH SENSITIVITY)    EKG EKG Interpretation Date/Time:  Thursday January 31 2023 20:29:27 EST Ventricular Rate:  78 PR Interval:  154 QRS Duration:  88 QT Interval:  338 QTC Calculation: 385 R Axis:   58  Text Interpretation: Normal sinus rhythm Cannot rule out Inferior infarct , age undetermined Abnormal ECG No previous ECGs available Confirmed by Jacalyn Lefevre [phone]) on 01/31/2023 9:47:02 PM  Radiology DG Chest 2 View  Result Date: 01/31/2023 CLINICAL DATA:  Chest pain and discomfort for 2 days, left arm numbness EXAM: CHEST - 2 VIEW COMPARISON:  None Available. FINDINGS: Frontal and lateral views of the chest demonstrate an unremarkable cardiac silhouette. Lung volumes are diminished, with crowding the pulmonary vasculature. Patchy consolidation at the lung bases favors atelectasis. No  effusion or pneumothorax. No acute bony abnormalities. IMPRESSION: 1. Low lung volumes, with crowding of the pulmonary vasculature and patchy bibasilar consolidation consistent with atelectasis. Electronically Signed   By: Sharlet Salina M.D.   On: 01/31/2023 22:38    Procedures Procedures    Medications Ordered in ED Medications  sodium chloride 0.9 % bolus 1,000 mL (has no administration in time range)  ketorolac (TORADOL) 30 MG/ML injection 30 mg (has no administration in time range)    ED Course/ Medical Decision Making/ A&P  Patient is a 27 year old male otherwise healthy presenting with complaints of chest pain.  He describes pain starting from his left clavicle and radiating into his back that is worse with movement, coughing, or breathing.  Patient arrives with stable vital signs and with no hypoxia.  He does have a low-grade temp of 100.1.  Laboratory studies obtained including CBC, metabolic panel, troponin, all of which are unremarkable.  CTA of the chest obtained showing bilateral segmental and subsegmental pulmonary emboli, but no right heart strain.  Patient has been started on heparin.  He is also received Toradol and morphine for pain.  Patient to be admitted to the hospitalist service for anticoagulation and further management.  Final Clinical Impression(s) / ED Diagnoses Final diagnoses:  None    Rx / DC Orders ED Discharge Orders     None         Geoffery Lyons, MD 02/01/23 [phone]

## 2023-02-01 NOTE — TOC Progression Note (Signed)
Transition of Care [facility]) - Progression Note    Patient Details  Name: Jonathan Gilbert MRN: [id number] Date of Birth: 1995/12/24  Transition of Care [facility]) CM/SW Contact  Geni Bers, RN Phone Number: 02/01/2023, 2:28 PM  Clinical Narrative:    Spoke with pt's mother [name] at beside. Mother asked for information on finances for pt. TOC do not do finances. Explained what TOC does. Pt may need with medication assistant and a PCP.         Expected Discharge Plan and Services                                               Social Determinants of Health (SDOH) Interventions SDOH Screenings   Food Insecurity: No Food Insecurity (02/01/2023)  Housing: Low Risk  (02/01/2023)  Transportation Needs: No Transportation Needs (02/01/2023)  Utilities: Not At Risk (02/01/2023)  Tobacco Use: Low Risk  (01/31/2023)    Readmission Risk Interventions     No data to display

## 2023-02-01 NOTE — ED Notes (Signed)
ED TO INPATIENT HANDOFF REPORT  ED Nurse Name and Phone #: [phone]  S Name/Age/Gender Burman Blacksmith III 27 y.o. male Room/Bed: APA18/APA18  Code Status   Code Status: Full Code  Home/SNF/Other Home Patient oriented to: self, place, time, and situation Is this baseline? Yes   Triage Complete: Triage complete  Chief Complaint Acute pulmonary embolism (HCC) [I26.99]  Triage Note Pt states chest pain x2 days in the left chest then around to his back and today up his neck and down arm today. Pain is reproducible.  91 140/90 98%   Allergies No Known Allergies  Level of Care/Admitting Diagnosis ED Disposition     ED Disposition  Admit   Condition  --   Comment  Hospital Area: [facility] [[id number]]  Level of Care: Telemetry [5]  Covid Evaluation: Asymptomatic - no recent exposure (last 10 days) testing not required  Diagnosis: Acute pulmonary embolism [facility]) [102725]  Admitting Physician: Lilyan Gilford [[id number]]  Attending Physician: Lilyan Gilford [[id number]]          B Medical/Surgery History Past Medical History:  Diagnosis Date   Diarrhea    Gastritis    H. pylori infection    Has History of H Pylori   Vomiting    History reviewed. No pertinent surgical history.   A IV Location/Drains/Wounds Patient Lines/Drains/Airways Status     Active Line/Drains/Airways     Name Placement date Placement time Site Days   Peripheral IV 02/01/23 20 G Anterior;Distal;Right;Upper Arm 02/01/23  0043  Arm  less than 1            Intake/Output Last 24 hours  Intake/Output Summary (Last 24 hours) at 02/01/2023 1206 Last data filed at 02/01/2023 0737 Gross per 24 hour  Intake 1240.17 ml  Output --  Net 1240.17 ml    Labs/Imaging Results for orders placed or performed during the hospital encounter of 01/31/23 (from the past 48 hour(s))  Basic metabolic panel     Status: None   Collection Time: 01/31/23  9:03 PM  Result Value  Ref Range   Sodium 137 135 - 145 mmol/L   Potassium 4.1 3.5 - 5.1 mmol/L   Chloride 103 98 - 111 mmol/L   CO2 23 22 - 32 mmol/L   Glucose, Bld 98 70 - 99 mg/dL    Comment: Glucose reference range applies only to samples taken after fasting for at least 8 hours.   BUN 9 6 - 20 mg/dL   Creatinine, Ser 6.38 0.61 - 1.24 mg/dL   Calcium 9.1 8.9 - 75.6 mg/dL   GFR, Estimated >43 >32 mL/min    Comment: (NOTE) Calculated using the CKD-EPI Creatinine Equation (2021)    Anion gap 11 5 - 15    Comment: Performed at [facility], [address], [place], [place] [address]  CBC     Status: Abnormal   Collection Time: 01/31/23  9:03 PM  Result Value Ref Range   WBC 10.7 (H) 4.0 - 10.5 K/uL   RBC 5.04 4.22 - 5.81 MIL/uL   Hemoglobin 15.7 13.0 - 17.0 g/dL   HCT 41.6 60.6 - 30.1 %   MCV 91.5 80.0 - 100.0 fL   MCH 31.2 26.0 - 34.0 pg   MCHC 34.1 30.0 - 36.0 g/dL   RDW 60.1 09.3 - 23.5 %   Platelets 189 150 - 400 K/uL   nRBC 0.0 0.0 - 0.2 %    Comment: Performed at [facility], [address], [place], [place] [address]  Troponin I (High Sensitivity)     Status: None   Collection Time: 01/31/23  9:03 PM  Result Value Ref Range   Troponin I (High Sensitivity) 2 <18 ng/L    Comment: (NOTE) Elevated high sensitivity troponin I (hsTnI) values and significant  changes across serial measurements may suggest ACS but many other  chronic and acute conditions are known to elevate hsTnI results.  Refer to the "Links" section for chest pain algorithms and additional  guidance. Performed at [facility], [address], [place], [place] [address]   Troponin I (High Sensitivity)     Status: None   Collection Time: 02/01/23 12:02 AM  Result Value Ref Range   Troponin I (High Sensitivity) 2 <18 ng/L    Comment: (NOTE) Elevated high sensitivity troponin I (hsTnI) values and significant  changes across serial measurements may suggest ACS but many other  chronic and acute conditions are known to elevate  hsTnI results.  Refer to the "Links" section for chest pain algorithms and additional  guidance. Performed at [facility], [address], [place], [place] [address]   CBC     Status: Abnormal   Collection Time: 02/01/23  3:27 AM  Result Value Ref Range   WBC 11.1 (H) 4.0 - 10.5 K/uL   RBC 4.45 4.22 - 5.81 MIL/uL   Hemoglobin 14.3 13.0 - 17.0 g/dL   HCT 01.0 27.2 - 53.6 %   MCV 90.6 80.0 - 100.0 fL   MCH 32.1 26.0 - 34.0 pg   MCHC 35.5 30.0 - 36.0 g/dL   RDW 64.4 03.4 - 74.2 %   Platelets 161 150 - 400 K/uL   nRBC 0.0 0.0 - 0.2 %    Comment: Performed at [facility], [address] [place], [place] [address]  Heparin level (unfractionated)     Status: Abnormal   Collection Time: 02/01/23  9:14 AM  Result Value Ref Range   Heparin Unfractionated <0.10 (L) 0.30 - 0.70 IU/mL    Comment: (NOTE) The clinical reportable range upper limit is being lowered to >1.10 to align with the FDA approved guidance for the current laboratory assay.  If heparin results are below expected values, and patient dosage has  been confirmed, suggest follow up testing of antithrombin III levels. Performed at [facility], [address] [place], [place] [address]   TSH     Status: None   Collection Time: 02/01/23  9:14 AM  Result Value Ref Range   TSH 2.005 0.350 - 4.500 uIU/mL    Comment: Performed by a 3rd Generation assay with a functional sensitivity of <=0.01 uIU/mL. Performed at [facility], [address] [place], [place] [address]   Magnesium     Status: None   Collection Time: 02/01/23  9:14 AM  Result Value Ref Range   Magnesium 2.1 1.7 - 2.4 mg/dL    Comment: Performed at [facility], [address], [place], [place] [address]  Hepatic function panel     Status: Abnormal   Collection Time: 02/01/23  9:14 AM  Result Value Ref Range   Total Protein 7.6 6.5 - 8.1 g/dL   Albumin 4.4 3.5 - 5.0 g/dL   AST 660 (H) 15 - 41 U/L   ALT 185 (H) 0 - 44 U/L   Alkaline Phosphatase 88 38 - 126  U/L   Total Bilirubin 2.3 (H) <1.2 mg/dL   Bilirubin, Direct 0.7 (H) 0.0 - 0.2 mg/dL   Indirect Bilirubin 1.6 (  H) 0.3 - 0.9 mg/dL    Comment: Performed at [facility], [address] [place], [place] [address]   US Venous Img Lower Bilateral (DVT)  Result Date: 02/01/2023 CLINICAL DATA:  Pulmonary embolism.  Evaluate for DVT. EXAM: BILATERAL LOWER EXTREMITY VENOUS DOPPLER ULTRASOUND TECHNIQUE: Gray-scale sonography with graded compression, as well as color Doppler and duplex ultrasound were performed to evaluate the lower extremity deep venous systems from the level of the common femoral vein and including the common femoral, femoral, profunda femoral, popliteal and calf veins including the posterior tibial, peroneal and gastrocnemius veins when visible. The superficial great saphenous vein was also interrogated. Spectral Doppler was utilized to evaluate flow at rest and with distal augmentation maneuvers in the common femoral, femoral and popliteal veins. COMPARISON:  None Available. FINDINGS: RIGHT LOWER EXTREMITY Common Femoral Vein: No evidence of thrombus. Normal compressibility, respiratory phasicity and response to augmentation. Saphenofemoral Junction: No evidence of thrombus. Normal compressibility and flow on color Doppler imaging. Profunda Femoral Vein: No evidence of thrombus. Normal compressibility and flow on color Doppler imaging. Femoral Vein: No evidence of thrombus. Normal compressibility, respiratory phasicity and response to augmentation. Popliteal Vein: No evidence of thrombus. Normal compressibility, respiratory phasicity and response to augmentation. Calf Veins: No evidence of thrombus. Normal compressibility and flow on color Doppler imaging. Superficial Great Saphenous Vein: No evidence of thrombus. Normal compressibility. Other Findings:  None. LEFT LOWER EXTREMITY Common Femoral Vein: No evidence of thrombus. Normal compressibility, respiratory phasicity and response to  augmentation. Saphenofemoral Junction: No evidence of thrombus. Normal compressibility and flow on color Doppler imaging. Profunda Femoral Vein: No evidence of thrombus. Normal compressibility and flow on color Doppler imaging. Femoral Vein: No evidence of thrombus. Normal compressibility, respiratory phasicity and response to augmentation. Popliteal Vein: No evidence of thrombus. Normal compressibility, respiratory phasicity and response to augmentation. Calf Veins: No evidence of thrombus. Normal compressibility and flow on color Doppler imaging. Superficial Great Saphenous Vein: No evidence of thrombus. Normal compressibility. Other Findings:  None. IMPRESSION: No evidence of deep venous thrombosis in either lower extremity. Electronically Signed   By: Simonne Come M.D.   On: 02/01/2023 09:44   CT Angio Chest PE W and/or Wo Contrast  Result Date: 02/01/2023 CLINICAL DATA:  Pulmonary embolism suspected, high probability. Chest pain for 2 days. EXAM: CT ANGIOGRAPHY CHEST WITH CONTRAST TECHNIQUE: Multidetector CT imaging of the chest was performed using the standard protocol during bolus administration of intravenous contrast. Multiplanar CT image reconstructions and MIPs were obtained to evaluate the vascular anatomy. RADIATION DOSE REDUCTION: This exam was performed according to the departmental dose-optimization program which includes automated exposure control, adjustment of the mA and/or kV according to patient size and/or use of iterative reconstruction technique. CONTRAST:  OMNIPAQUE IOHEXOL 350 MG/ML SOLN COMPARISON:  None Available. FINDINGS: Cardiovascular: The heart is normal in size and there is no pericardial effusion. The aorta and pulmonary trunk are normal in caliber. Pulmonary artery filling defects are noted in the segmental and subsegmental pulmonary arteries in the lower lobes bilaterally. Evaluation of the pulmonary arteries is limited due to respiratory motion artifact. No evidence of  right heart strain. Mediastinum/Nodes: No mediastinal, hilar, or axillary lymphadenopathy. The thyroid gland, trachea, and esophagus are within normal limits. Lungs/Pleura: Patchy airspace disease is noted in the lower lobes bilaterally, possible atelectasis, infiltrates, or pulmonary infarcts. No effusion or pneumothorax. Upper Abdomen: No acute abnormality. Musculoskeletal: No acute osseous abnormality. Review of the MIP images confirms the above findings. IMPRESSION: 1. Bilateral  segmental and subsegmental pulmonary emboli. No evidence of right heart strain. 2. Patchy airspace disease at the lung bases, possible atelectasis, infiltrate, or pulmonary infarcts. Critical Value/emergent results were called by telephone at the time of interpretation on 02/01/2023 at 1:45 am to provider Geoffery Lyons , who verbally acknowledged these results. Electronically Signed   By: Thornell Sartorius M.D.   On: 02/01/2023 01:47   DG Chest 2 View  Result Date: 01/31/2023 CLINICAL DATA:  Chest pain and discomfort for 2 days, left arm numbness EXAM: CHEST - 2 VIEW COMPARISON:  None Available. FINDINGS: Frontal and lateral views of the chest demonstrate an unremarkable cardiac silhouette. Lung volumes are diminished, with crowding the pulmonary vasculature. Patchy consolidation at the lung bases favors atelectasis. No effusion or pneumothorax. No acute bony abnormalities. IMPRESSION: 1. Low lung volumes, with crowding of the pulmonary vasculature and patchy bibasilar consolidation consistent with atelectasis. Electronically Signed   By: Sharlet Salina M.D.   On: 01/31/2023 22:38    Pending Labs Unresulted Labs (From admission, onward)     Start     Ordered   02/02/23 0500  Basic metabolic panel  Tomorrow morning,   R        02/01/23 0739   02/02/23 0500  Heparin level (unfractionated)  Daily,   R      02/01/23 1056   02/01/23 1800  Heparin level (unfractionated)  ONCE - URGENT,   URGENT        02/01/23 1056   02/01/23 0736   HIV Antibody (routine testing w rflx)  (HIV Antibody (Routine testing w reflex) panel)  Once,   R        02/01/23 0739   02/01/23 0500  CBC  Daily,   R      02/01/23 0303            Vitals/Pain Today's Vitals   02/01/23 1010 02/01/23 1100 02/01/23 1130 02/01/23 1159  BP:   (!) 159/97   Pulse:   82   Resp:   (!) 27   Temp:      TempSrc:      SpO2:   94%   Weight:      Height:      PainSc: 7  7   7      Isolation Precautions No active isolations  Medications Medications  heparin ADULT infusion 100 units/mL (25000 units/234mL) (1,800 Units/hr Intravenous New Bag/Given 02/01/23 0314)  acetaminophen (TYLENOL) tablet 650 mg (has no administration in time range)    Or  acetaminophen (TYLENOL) suppository 650 mg (has no administration in time range)  HYDROmorphone (DILAUDID) injection 1 mg (1 mg Intravenous Given 02/01/23 1125)  oxyCODONE (Oxy IR/ROXICODONE) immediate release tablet 5 mg (5 mg Oral Given 02/01/23 1019)  ondansetron (ZOFRAN) tablet 4 mg (has no administration in time range)    Or  ondansetron (ZOFRAN) injection 4 mg (has no administration in time range)  polyethylene glycol (MIRALAX / GLYCOLAX) packet 17 g (has no administration in time range)  pantoprazole (PROTONIX) EC tablet 40 mg (40 mg Oral Given 02/01/23 1019)  heparin bolus via infusion 3,000 Units (has no administration in time range)  sodium chloride 0.9 % bolus 1,000 mL (0 mLs Intravenous Stopped 02/01/23 0250)  ketorolac (TORADOL) 30 MG/ML injection 30 mg (30 mg Intravenous Given 02/01/23 0043)  iohexol (OMNIPAQUE) 350 MG/ML injection 100 mL (100 mLs Intravenous Contrast Given 02/01/23 0057)  morphine (PF) 4 MG/ML injection 4 mg (4 mg Intravenous Given 02/01/23 0306)  ondansetron (ZOFRAN)  injection 4 mg (4 mg Intravenous Given 02/01/23 0306)  heparin bolus via infusion 6,000 Units (6,000 Units Intravenous Bolus from Bag 02/01/23 0317)    Mobility walks     Focused Assessments Pulmonary Assessment  Handoff:  Lung sounds:          R Recommendations: See Admitting Provider Note  Report given to:   Additional Notes: constant chest pain at 7/10 even with pain management

## 2023-02-01 NOTE — Progress Notes (Signed)
*  PRELIMINARY RESULTS* Echocardiogram 2D Echocardiogram has been performed with Definity.  Stacey Drain 02/01/2023, 4:35 PM

## 2023-02-01 NOTE — Progress Notes (Signed)
PHARMACY - ANTICOAGULATION CONSULT NOTE  Pharmacy Consult for heparin Indication: pulmonary embolus  No Known Allergies  Patient Measurements: Height: 6\' 2"  (188 cm) Weight: 127 kg (280 lb) IBW/kg (Calculated) : 82.2 Heparin Dosing Weight: 110 kg  Vital Signs: Temp: 98.6 F (37 C) (11/08 0815) Temp Source: Oral (11/08 0815) BP: 135/90 (11/08 0800) Pulse Rate: 87 (11/08 0800)  Labs: Recent Labs    01/31/23 2103 02/01/23 0002 02/01/23 0327 02/01/23 0914  HGB 15.7  --  14.3  --   HCT 46.1  --  40.3  --   PLT 189  --  161  --   HEPARINUNFRC  --   --   --  <0.10*  CREATININE 1.05  --   --   --   TROPONINIHS 2 2  --   --     Estimated Creatinine Clearance: 150.9 mL/min (by C-G formula based on SCr of 1.05 mg/dL).   Medical History: Past Medical History:  Diagnosis Date   Diarrhea    Gastritis    H. pylori infection    Has History of H Pylori   Vomiting    Assessment: 49 yoM presented to [facility] ED with complaints of chest pain. Pharmacy consulted to dose heparin for pulmonary embolism.  -CTA shows bilateral segmental and subsegmental emboli, no RHS -Hgb 15.7, plts 189, no prior anticoagulation   HL <0.1, subtherapeutic. No issues with infusion per RN  Goal of Therapy:  Heparin level 0.3-0.7 units/ml Monitor platelets by anticoagulation protocol: Yes   Plan:  Give 3000 units bolus x 1 Increase heparin infusion at 2200 units/hr Check anti-Xa level in 6 hours and daily while on heparin  Elder Cyphers, BS Pharm D, BCPS Clinical Pharmacist 02/01/2023 10:18 AM

## 2023-02-01 NOTE — Assessment & Plan Note (Signed)
-  Low-calorie diet, portion control and increase physical activity discussed with patient -Body mass index is 35.95 kg/m.

## 2023-02-01 NOTE — Progress Notes (Signed)
PHARMACY - ANTICOAGULATION CONSULT NOTE  Pharmacy Consult for heparin Indication: pulmonary embolus  No Known Allergies  Patient Measurements: Height: 6\' 2"  (188 cm) Weight: 127 kg (280 lb 0.1 oz) IBW/kg (Calculated) : 82.2 Heparin Dosing Weight: 110 kg  Vital Signs: Temp: 100 F (37.8 C) (11/08 1343) Temp Source: Oral (11/08 1216) BP: 140/87 (11/08 1730) Pulse Rate: 96 (11/08 1730)  Labs: Recent Labs    01/31/23 2103 02/01/23 0002 02/01/23 0327 02/01/23 0914 02/01/23 1736  HGB 15.7  --  14.3  --   --   HCT 46.1  --  40.3  --   --   PLT 189  --  161  --   --   HEPARINUNFRC  --   --   --  <0.10* 0.81*  CREATININE 1.05  --   --   --   --   TROPONINIHS 2 2  --   --   --     Estimated Creatinine Clearance: 150.9 mL/min (by C-G formula based on SCr of 1.05 mg/dL).   Medical History: Past Medical History:  Diagnosis Date   Diarrhea    Gastritis    H. pylori infection    Has History of H Pylori   Vomiting    Assessment: 17 yoM presented to [facility] ED with complaints of chest pain. Pharmacy consulted to dose heparin for pulmonary embolism.  -CTA shows bilateral segmental and subsegmental emboli, no RHS -Hgb 15.7, plts 189, no prior anticoagulation   Date Time Results Comments 11/8 0914 HL <0.1 Subtherapeutic. No issues with infusion per RN, rate 1800 u/hr 11/8 1736 HL 0.81 SUPRA-therapeutic, rate 2200 units/hr  Goal of Therapy:  Heparin level 0.3-0.7 units/ml Monitor platelets by anticoagulation protocol: Yes   Plan:  Decrease heparin infusion rate to 2000 units/hr Check HL level 6 hours after rate change and daily once stable Repeat CBC daily while on heparin infusion  Shain Pauwels Rodriguez-Guzman PharmD, BCPS 02/01/2023 6:43 PM

## 2023-02-01 NOTE — Progress Notes (Signed)
MEWS elevated d/t fever and tachycardia.  Fever treated with PO Tylenol and pain treated with IV Dilaudid.

## 2023-02-01 NOTE — ED Notes (Signed)
Patient transported to CT 

## 2023-02-01 NOTE — Progress Notes (Signed)
   02/01/23 1426  TOC Brief Assessment  Insurance and Status Reviewed  Patient has primary care physician No  Home environment has been reviewed Single home  Prior level of function: Independent  Prior/Current Home Services No current home services  Social Determinants of Health Reivew SDOH reviewed no interventions necessary  Readmission risk has been reviewed Yes  Transition of care needs transition of care needs identified, TOC will continue to follow

## 2023-02-01 NOTE — TOC Progression Note (Signed)
Transition of Care [facility]) - Progression Note    Patient Details  Name: Jonathan Gilbert MRN: [id number] Date of Birth: October 06, 1995  [facility] CM/SW Contact  Armanda Heritage, RN Phone Number: 02/01/2023, 2:59 PM  Clinical Narrative:    CM spoke with patient's mother who confirms that patient is in need of a pcp.  A pcp appointment was set up for Wednesday November 27 at [facility] at 130pm.  Information placed on AVS.        Expected Discharge Plan and Services                                               Social Determinants of Health (SDOH) Interventions SDOH Screenings   Food Insecurity: No Food Insecurity (02/01/2023)  Housing: Low Risk  (02/01/2023)  Transportation Needs: No Transportation Needs (02/01/2023)  Utilities: Not At Risk (02/01/2023)  Tobacco Use: Low Risk  (01/31/2023)    Readmission Risk Interventions     No data to display

## 2023-02-01 NOTE — Assessment & Plan Note (Signed)
-  Protonix 40 mg by mouth daily has been started -Lifestyle modification discussed with patient.

## 2023-02-01 NOTE — H&P (Signed)
History and Physical    Patient: Jonathan Gilbert WUJ:[id number] DOB: [birth year] DOA: 01/31/2023 DOS: the patient was seen and examined on 02/01/2023 PCP: Patient, No Pcp Per  Patient coming from: Home  Chief Complaint:  Chief Complaint  Patient presents with   Chest Pain   HPI: Jonathan Gilbert is a 27 y.o. male with medical history significant of gastroesophageal flux disease/gastritis and marijuana use; who presented to the hospital secondary to chest pain.  Pain has been present for the last 3 days now, pleuritic in nature in the middle of his chest around clavicle area and radiating toward his back left more than right.  Pain is worse with any movement or deep breath.  No fever, no productive coughing spells, no sick contacts, no nausea vomiting.  Patient also expressed no dysuria, hematuria, melena, hematochezia, abdominal pain, focal weaknesses or any other complaints.  On discussion patient expressed history of family members with blood clots.  He denies any recent orthopedic surgery, IV drug use, lack of mobility and any recent vaccinations.   Workup in the ED demonstrating positive pulmonary embolism bilaterally segmental and subsegmental in disposition.  Patient started on heparin and analgesics provided.  Stable vital signs and good saturation on room air appreciated.  TRH contacted to place patient in the hospital for further evaluation and management.  Review of Systems: As mentioned in the history of present illness. All other systems reviewed and are negative. Past Medical History:  Diagnosis Date   Diarrhea    Gastritis    H. pylori infection    Has History of H Pylori   Vomiting    History reviewed. No pertinent surgical history. Social History:  reports that he has never smoked. He has never used smokeless tobacco. He reports current alcohol use. He reports current drug use. Drug: Marijuana.  No Known Allergies  Family History  Problem Relation Age of Onset    Ulcers Father     Prior to Admission medications   Medication Sig Start Date End Date Taking? Authorizing Provider  cyclobenzaprine (FLEXERIL) 10 MG tablet Take 1 tablet (10 mg total) by mouth 2 (two) times daily as needed for muscle spasms. 01/23/22   Fayrene Helper, PA-C  desloratadine (CLARINEX) 5 MG tablet Take 5 mg by mouth daily.   07/18/10   [provider]  esomeprazole (NEXIUM) 40 MG capsule Take 40 mg by mouth daily before breakfast.   07/18/10   [provider]  ibuprofen (ADVIL) 600 MG tablet Take 1 tablet (600 mg total) by mouth every 6 (six) hours as needed. 01/23/22   Fayrene Helper, PA-C    Physical Exam: Vitals:   02/01/23 0500 02/01/23 0700 02/01/23 0800 02/01/23 0815  BP: (!) 143/98 (!) 146/88 (!) 135/90   Pulse: 79 88 87   Resp: (!) 25 20 (!) 22   Temp:    98.6 F (37 C)  TempSrc:    Oral  SpO2: 96% 94% 92%   Weight:      Height:       General exam: Alert, awake, oriented x 3; complaining of chest discomfort.  In no major distress.  Good saturation on room air. Respiratory system: Clear to auscultation. Respiratory effort normal.  No using accessory muscle. Cardiovascular system:RRR. No murmurs, rubs, gallops or JVD. Gastrointestinal system: Abdomen is obese, nondistended, soft and nontender. No organomegaly or masses felt. Normal bowel sounds heard. Central nervous system: Moving 4 limbs spontaneously.  No focal neurological deficits. Extremities: No cyanosis  or clubbing; patient reports no tenderness on palpation.  Trace edema appreciated bilaterally (unchanged from baseline per patient reports). Skin: No petechiae. Psychiatry: Judgement and insight appear normal. Mood & affect appropriate.   Data Reviewed: CBC: White blood cells 11.1, hemoglobin 14.3 and platelet count 161K Basic metabolic panel: Sodium 137, potassium 4.1, chloride 103, bicarb 23, BUN 9, creatinine 1.05 and GFR >60   Assessment and Plan: Acute pulmonary embolism  (HCC) -Appears to be unprovoked event -Patient expressed no recent long travels, orthopedic surgery, immobility, intravenous drug use or COVID vaccination. -He reported some family history of blood clots -2D echo and lower extremity Doppler has been ordered -Planning 24-48 hours of IV heparin with subsequent transition to Eliquis. -As needed analgesics will be provided for pleuritic chest pain -Good saturation on room air and normal vital signs otherwise appreciated.   Class 2 obesity -Low-calorie diet, portion control and increase physical activity discussed with patient -Body mass index is 35.95 kg/m.  Gastritis -Protonix 40 mg by mouth daily has been started -Lifestyle modification discussed with patient.  Marijuana use -Cessation counseling provided   Advance Care Planning:   Code Status: Full Code   Consults: None  Family Communication: Significant other at bedside  Severity of Illness: The appropriate patient status for this patient is OBSERVATION. Observation status is judged to be reasonable and necessary in order to provide the required intensity of service to ensure the patient's safety. The patient's presenting symptoms, physical exam findings, and initial radiographic and laboratory data in the context of their medical condition is felt to place them at decreased risk for further clinical deterioration. Furthermore, it is anticipated that the patient will be medically stable for discharge from the hospital within 2 midnights of admission.   Author: Vassie Loll, MD 02/01/2023 9:08 AM  For on call review [email or web address].

## 2023-02-02 LAB — CBC
HCT: 42.4 % (ref 39.0–52.0)
Hemoglobin: 14.8 g/dL (ref 13.0–17.0)
MCH: 31.5 pg (ref 26.0–34.0)
MCHC: 34.9 g/dL (ref 30.0–36.0)
MCV: 90.2 fL (ref 80.0–100.0)
Platelets: 144 10*3/uL — ABNORMAL LOW (ref 150–400)
RBC: 4.7 MIL/uL (ref 4.22–5.81)
RDW: 11.8 % (ref 11.5–15.5)
WBC: 13 10*3/uL — ABNORMAL HIGH (ref 4.0–10.5)
nRBC: 0 % (ref 0.0–0.2)

## 2023-02-02 LAB — BASIC METABOLIC PANEL
Anion gap: 9 (ref 5–15)
BUN: 10 mg/dL (ref 6–20)
CO2: 24 mmol/L (ref 22–32)
Calcium: 8.9 mg/dL (ref 8.9–10.3)
Chloride: 103 mmol/L (ref 98–111)
Creatinine, Ser: 1.03 mg/dL (ref 0.61–1.24)
GFR, Estimated: >60 mL/min (ref >60)
Glucose, Bld: 107 mg/dL — ABNORMAL HIGH (ref 70–99)
Potassium: 4 mmol/L (ref 3.5–5.1)
Sodium: 136 mmol/L (ref 135–145)

## 2023-02-02 LAB — PROCALCITONIN: Procalcitonin: 0.1 ng/mL

## 2023-02-02 LAB — HEPARIN LEVEL (UNFRACTIONATED)
Heparin Unfractionated: 0.48 [IU]/mL (ref 0.30–0.70)
Heparin Unfractionated: 0.41 [IU]/mL (ref 0.30–0.70)

## 2023-02-02 MED ORDER — ENOXAPARIN SODIUM 150 MG/ML IJ SOSY
130.0000 mg | PREFILLED_SYRINGE | Freq: Two times a day (BID) | INTRAMUSCULAR | Status: DC
  Administered 2023-02-02 – 2023-02-03 (×3): 130 mg via SUBCUTANEOUS
  Filled 2023-02-02 (×6): qty 1

## 2023-02-02 MED ORDER — DIPHENHYDRAMINE HCL 50 MG/ML IJ SOLN
25.0000 mg | Freq: Once | INTRAMUSCULAR | Status: AC
  Administered 2023-02-02: 25 mg via INTRAVENOUS
  Filled 2023-02-02: qty 1

## 2023-02-02 MED ORDER — HYDRALAZINE HCL 20 MG/ML IJ SOLN: 10.0000 mg | Freq: Three times a day (TID) | INTRAMUSCULAR | Status: DC | PRN

## 2023-02-02 NOTE — Progress Notes (Signed)
PHARMACY - ANTICOAGULATION CONSULT NOTE  Pharmacy Consult for heparin--> lovenox Indication: pulmonary embolus  No Known Allergies  Patient Measurements: Height: 6\' 2"  (188 cm) Weight: 127 kg (280 lb 0.1 oz) IBW/kg (Calculated) : 82.2 Heparin Dosing Weight: 110 kg  Vital Signs: Temp: 100.4 F (38 C) (11/09 0853) Temp Source: Oral (11/09 0853) BP: 149/96 (11/09 0853) Pulse Rate: 97 (11/09 0853)  Labs: Recent Labs    01/31/23 2103 02/01/23 0002 02/01/23 0327 02/01/23 0914 02/01/23 1736 02/02/23 0055 02/02/23 0754  HGB 15.7  --  14.3  --   --  14.8  --   HCT 46.1  --  40.3  --   --  42.4  --   PLT 189  --  161  --   --  144*  --   HEPARINUNFRC  --   --   --    < > 0.81* 0.48 0.41  CREATININE 1.05  --   --   --   --  1.03  --   TROPONINIHS 2 2  --   --   --   --   --    < > = values in this interval not displayed.    Estimated Creatinine Clearance: 153.9 mL/min (by C-G formula based on SCr of 1.03 mg/dL).   Medical History: Past Medical History:  Diagnosis Date   Diarrhea    Gastritis    H. pylori infection    Has History of H Pylori   Vomiting    Assessment: 25 yoM presented to [facility] ED with complaints of chest pain. Pharmacy consulted to dose heparin for pulmonary embolism.  -CTA shows bilateral segmental and subsegmental emboli, no RHS -Hgb 15.7, plts 189, no prior anticoagulation   Transition to Lovenox today. Concerns for safe discharge with PE and no PCP nor insurance. MD to d/w patient and family with options for PCP and treatment orally. Likely discharge with eliquis and see if patient will qualify for patient assistance through the manufacturer.   Goal of Therapy:  Heparin level 0.3-0.7 units/ml Monitor platelets by anticoagulation protocol: Yes   Plan:  Lovenox 1mg /kg (130mg ) sq q12h Monitor for S/S of bleeding F/U transition to po tx   Elder Cyphers, BS Pharm D, BCPS Clinical Pharmacist 02/02/2023 10:26 AM

## 2023-02-02 NOTE — TOC Progression Note (Signed)
Transition of Care [facility]) - Progression Note    Patient Details  Name: Jonathan Gilbert MRN: [id number] Date of Birth: 09-18-1995  Transition of Care [facility]) CM/SW Contact  Jonathan Gilbert, [place] Phone Number: 02/02/2023, 1:37 PM  Clinical Narrative:    During progression MD made clear that pt  has a medication need and for safety we need to assist in addition to providing instruction to apply for health coverage.  CSW contacted RN case Production designer, theatre/television/film for [employer] program- covering all scripts at DC, Match letter provided.   CSW visited pt at bedside, girlfriend Jonathan Gilbert present as well, pt agreed we could speak with her present. CSW reviewed importance of medication and that MD wanted to ensure that he has scripts at DC- explained and provided the [facility] letter.  Advised pt, while having the 30 day supply, it is important that he seek medical prescription coverage to continue on essential medication. Discussed location of Medicaid office. Also [facility]. Important  to following up with physician at [facility], and advised there is a PCP list added to AVS papers.  [name] was attentive, pt nodded and affirmed, but laying down.   CSW called room after I left [name] answered- advised that  I previously called pt mother as well as she is a contact but it was a work number, VM.  Restated that pt needs to work on prescription insurance/ for future  refills and reminded to support pt in locating a PCP as he needs follow up.  She indicated that she understood.   TOC to continue to follow.      Barriers to Discharge: Continued Medical Work up  Expected Discharge Plan and Services                                               Social Determinants of Health (SDOH) Interventions SDOH Screenings   Food Insecurity: No Food Insecurity (02/01/2023)  Housing: Low Risk  (02/01/2023)  Transportation Needs: No Transportation Needs (02/01/2023)  Utilities:  Not At Risk (02/01/2023)  Tobacco Use: Low Risk  (01/31/2023)    Readmission Risk Interventions     No data to display

## 2023-02-02 NOTE — Care Management (Signed)
Patient needs Medication Assistance. His choice of Pharmacy is [facility] as [facility]. [facility] does not participate [facility] program, will have to be sent to [facility] or [facility]. MD made aware. MATCH MEDICATION ASSISTANCE CARD Pharmacies please call: [phone] for claim processing assistance.  Rx BIN: [id number] Rx Group: [id number] Rx PCN: [id number] Relationship Code: 1 Person Code: [id number]  Patient ID (MRN): [id number]     Patient Name: Jonathan Gilbert    Patient DOB: [birth year]    Discharge Date: 02/02/2023    Expiration Date:11/17//2024 (must be filled within 7 days of discharge)  Dear  [name] You have been approved to have the prescriptions written by your discharging physician filled through our [facility] (Medication Assistance Through [facility]) program. This program allows for a one-time (no refills) 34-day supply of selected medications for a low copay amount.  The copay is $3.00 per prescription. For instance, if you have one prescription, you will pay $3.00; for two prescriptions, you pay $6.00; for three prescriptions, you pay $9.00; and so on. Only certain pharmacies are participating in this program with [facility]. You will need to select one of the pharmacies from the attached lists and take your prescriptions, this letter, and your photo ID to one of the participating pharmacies.  We are excited that you are able to use the [facility] program to get your medications. These prescriptions must be filled within 7 days of hospital discharge or they will no longer be valid for the [facility] program. Should you have any problems with your prescriptions please contact your case management team member at [phone] for [facility] [place] or [phone] for [facility].  Thank you, [facility]

## 2023-02-02 NOTE — Progress Notes (Signed)
Progress Note   Patient: Jonathan Gilbert:[id number] DOB: [birth year] DOA: 01/31/2023     1 DOS: the patient was seen and examined on 02/02/2023   Brief hospital admission course: Jonathan Gilbert is a 27 y.o. male with medical history significant of gastroesophageal flux disease/gastritis and marijuana use; who presented to the hospital secondary to chest pain.  Pain has been present for the last 3 days now, pleuritic in nature in the middle of his chest around clavicle area and radiating toward his back left more than right.  Pain is worse with any movement or deep breath.  No fever, no productive coughing spells, no sick contacts, no nausea vomiting.  Patient also expressed no dysuria, hematuria, melena, hematochezia, abdominal pain, focal weaknesses or any other complaints.   On discussion patient expressed history of family members with blood clots.  He denies any recent orthopedic surgery, IV drug use, lack of mobility and any recent vaccinations.    Workup in the ED demonstrating positive pulmonary embolism bilaterally segmental and subsegmental in disposition.  Patient started on heparin and analgesics provided.  Stable vital signs and good saturation on room air appreciated.  TRH contacted to place patient in the hospital for further evaluation and management.  Assessment and Plan: Acute pulmonary embolism -Unprovoked -Patient with family members that have history of blood clots -2D echo demonstrating preserved ejection fraction and no heart strain -Lower extremity Dopplers without presence of DVT -Continue treatment with Lovenox and anticipate transition to oral Eliquis at discharge for extended period of time. -Good saturation appreciated on exam; still complaining of intermittent pleuritic chest pain.  And has low-grade temperature -No infiltrates or acute sterile infection appreciated. -Continue supportive care, antipyretics and analgesics.  Class II obesity -Low-calorie  diet, portion control and increase physical activity discussed with patient. -Body mass index is 35.95 kg/m.   History of gastritis/GERD -Continue PPI -Lifestyle modification discussed with patient.  Marijuana use: Cessation counseling provided.   Subjective:  Low-grade temperature appreciated. Patient reports ongoing pleuritic chest pain and short winded sensation with activity.  Good saturation on room air.  No nausea, no vomiting.  Physical Exam: Vitals:   02/02/23 0509 02/02/23 0600 02/02/23 0853 02/02/23 1431  BP: (!) 163/90  (!) 149/96 (!) 158/91  Pulse: (!) 108  97 (!) 103  Resp: (!) 26 20 19    Temp: 99.2 F (37.3 C)  (!) 100.4 F (38 C)   TempSrc: Oral  Oral   SpO2: 94%  93% 90%  Weight:      Height:       General exam: Alert, awake, oriented x 3; complaining of intermittent pleuritic chest pain and has experienced low-grade fever.  Good saturation on room air. Respiratory system: Clear to auscultation. Respiratory effort normal.  No using accessory muscle. Cardiovascular system:RRR.  No rubs, no gallops, no JVD. Gastrointestinal system: Abdomen is obese, nondistended, soft and nontender. No organomegaly or masses felt. Normal bowel sounds heard. Central nervous system: Alert and oriented. No focal neurological deficits. Extremities: No cyanosis, clubbing or tenderness. Skin: No petechiae. Psychiatry: Judgement and insight appear normal. Mood & affect appropriate.   Data Reviewed: CBC: WBCs 13.0, hemoglobin 14.8 and platelet count 1 44K Basic metabolic panel: Sodium 136, potassium 4.0, chloride 103, bicarb 24, BUN 10, creatinine 1.03 and GFR >60 Procalcitonin: 0.10   Family Communication: Significant other at bedside.  Disposition: Status is: Inpatient Remains inpatient appropriate because: Continue treatment with anticoagulation therapy for PE.   Planned Discharge Destination:  Home  Time spent: 50 minutes  Author: Vassie Loll, MD 02/02/2023 6:44  PM  For on call review [email or web address].

## 2023-02-02 NOTE — Progress Notes (Signed)
PHARMACY - ANTICOAGULATION CONSULT NOTE  Pharmacy Consult for heparin Indication: pulmonary embolus  No Known Allergies  Patient Measurements: Height: 6\' 2"  (188 cm) Weight: 127 kg (280 lb 0.1 oz) IBW/kg (Calculated) : 82.2 Heparin Dosing Weight: 110 kg  Vital Signs: Temp: 100.1 F (37.8 C) (11/08 2158) Temp Source: Oral (11/08 2158) BP: 133/83 (11/08 2158) Pulse Rate: 94 (11/08 2158)  Labs: Recent Labs    01/31/23 2103 02/01/23 0002 02/01/23 0327 02/01/23 0914 02/01/23 1736 02/02/23 0055  HGB 15.7  --  14.3  --   --  14.8  HCT 46.1  --  40.3  --   --  42.4  PLT 189  --  161  --   --  144*  HEPARINUNFRC  --   --   --  <0.10* 0.81* 0.48  CREATININE 1.05  --   --   --   --  1.03  TROPONINIHS 2 2  --   --   --   --     Estimated Creatinine Clearance: 153.9 mL/min (by C-G formula based on SCr of 1.03 mg/dL).   Medical History: Past Medical History:  Diagnosis Date   Diarrhea    Gastritis    H. pylori infection    Has History of H Pylori   Vomiting    Assessment: 52 yoM presented to [facility] ED with complaints of chest pain. Pharmacy consulted to dose heparin for pulmonary embolism.  -CTA shows bilateral segmental and subsegmental emboli, no RHS -Hgb 15.7, plts 189, no prior anticoagulation   Date Time Results Comments 11/8 0914 HL <0.1 Subtherapeutic. No issues with infusion per RN, rate 1800 u/hr 11/8 1736 HL 0.81 SUPRA-therapeutic, rate 2200 units/hr 11/9 0055 HL 0.48 Therapeutic @2000  units/hr, CBC stable, no issues documented  Goal of Therapy:  Heparin level 0.3-0.7 units/ml Monitor platelets by anticoagulation protocol: Yes   Plan:  Continue heparin infusion rate at 2000 units/hr Check confirmatory heparin level in 6 hours and daily once stable Repeat CBC daily while on heparin infusion  Arabella Merles, PharmD. Clinical Pharmacist 02/02/2023 1:25 AM

## 2023-02-03 ENCOUNTER — Inpatient Hospital Stay (HOSPITAL_COMMUNITY): Payer: Self-pay

## 2023-02-03 LAB — CBC
HCT: 42.1 % (ref 39.0–52.0)
Hemoglobin: 15 g/dL (ref 13.0–17.0)
MCH: 32 pg (ref 26.0–34.0)
MCHC: 35.6 g/dL (ref 30.0–36.0)
MCV: 89.8 fL (ref 80.0–100.0)
Platelets: 174 10*3/uL (ref 150–400)
RBC: 4.69 MIL/uL (ref 4.22–5.81)
RDW: 11.8 % (ref 11.5–15.5)
WBC: 11.1 10*3/uL — ABNORMAL HIGH (ref 4.0–10.5)
nRBC: 0 % (ref 0.0–0.2)

## 2023-02-03 MED ORDER — OXYCODONE HCL 5 MG PO TABS
5.0000 mg | ORAL_TABLET | Freq: Four times a day (QID) | ORAL | Status: DC | PRN
  Administered 2023-02-03: 10 mg via ORAL
  Filled 2023-02-03: qty 2

## 2023-02-03 MED ORDER — APIXABAN 5 MG PO TABS: 5.0000 mg | ORAL_TABLET | Freq: Two times a day (BID) | ORAL | Status: DC

## 2023-02-03 MED ORDER — APIXABAN 5 MG PO TABS
10.0000 mg | ORAL_TABLET | Freq: Two times a day (BID) | ORAL | Status: DC
  Administered 2023-02-03 – 2023-02-04 (×3): 10 mg via ORAL
  Filled 2023-02-03 (×3): qty 2

## 2023-02-03 NOTE — Progress Notes (Signed)
Pt lying in bed appears comfortable not in any distress. Pt complains of pain 10/10 in mid back area more so on the left intermittent with movement. Pt complains of a congested cough and states he has coughed up some dark blood, MD made aware. See flowsheet for vitals. Lungs clear throughout diminished at the bases. Pt not taking deep breaths due to pain. Educated patient on deep breathing and use of incentive spirometry. SCD's on. Mom requested another chest xray to be done. MD ordered. IV flushed and SL.

## 2023-02-03 NOTE — Progress Notes (Signed)
Progress Note   Patient: Jonathan Gilbert [id number]:[id number] DOB: 1995/06/26 DOA: 01/31/2023     2 DOS: the patient was seen and examined on 02/03/2023   Brief hospital admission course: Jonathan Gilbert is a 27 y.o. male with medical history significant of gastroesophageal flux disease/gastritis and marijuana use; who presented to the hospital secondary to chest pain.  Pain has been present for the last 3 days now, pleuritic in nature in the middle of his chest around clavicle area and radiating toward his back left more than right.  Pain is worse with any movement or deep breath.  No fever, no productive coughing spells, no sick contacts, no nausea vomiting.  Patient also expressed no dysuria, hematuria, melena, hematochezia, abdominal pain, focal weaknesses or any other complaints.   On discussion patient expressed history of family members with blood clots.  He denies any recent orthopedic surgery, IV drug use, lack of mobility and any recent vaccinations.    Workup in the ED demonstrating positive pulmonary embolism bilaterally segmental and subsegmental in disposition.  Patient started on heparin and analgesics provided.  Stable vital signs and good saturation on room air appreciated.  TRH contacted to place patient in the hospital for further evaluation and management.  Assessment and Plan: Acute pulmonary embolism -Unprovoked -Patient with family members that have history of blood clots -2D echo demonstrating preserved ejection fraction and no heart strain -Lower extremity Dopplers without presence of DVT -Anticoagulation therapy will be transition to Eliquis -Given complaints of bloody sputum and chest x-ray will be ordered. -Good saturation appreciated on exam; still complaining of intermittent pleuritic chest pain.   -Continue as needed analgesics and intermittent antipyretics.  Class II obesity -Low-calorie diet, portion control and increase physical activity discussed with  patient. -Body mass index is 35.95 kg/m.   History of gastritis/GERD -Continue PPI -Lifestyle modification discussed with patient.  Marijuana use: Cessation counseling provided.   Subjective:  Continue experiencing intermittent episodes of pleuritic chest pain and on today's examination per nursing report bloody sputum and productive coughing spells.  Physical Exam: Vitals:   02/02/23 0853 02/02/23 1431 02/02/23 2117 02/03/23 0600  BP: (!) 149/96 (!) 158/91 139/85 113/76  Pulse: 97 (!) 103 (!) 106   Resp: 19  20 14   Temp: (!) 100.4 F (38 C)  100.1 F (37.8 C) 99.6 F (37.6 C)  TempSrc: Oral  Oral Oral  SpO2: 93% 90% 92% 92%  Weight:      Height:       General exam: Alert, awake, oriented x 3; still experiencing intermittent pleuritic pain.  And having sound mild productive cough (bloody sputum appreciated).  No nausea, no vomiting. Respiratory system: Positive scattered rhonchi, no wheezing, no crackles. Cardiovascular system: Rate controlled, no rubs, no gallops, no JVD. Gastrointestinal system: Abdomen is obese, nondistended, soft and nontender. No organomegaly or masses felt. Normal bowel sounds heard. Central nervous system: Alert and oriented. No focal neurological deficits. Extremities: No cyanosis or clubbing. Skin: No petechiae. Psychiatry: Judgement and insight appear normal. Mood & affect appropriate.   Data Reviewed: CBC: WBCs 13.0, hemoglobin 14.8 and platelet count 1 44K Basic metabolic panel: Sodium 136, potassium 4.0, chloride 103, bicarb 24, BUN 10, creatinine 1.03 and GFR >60 Procalcitonin: 0.10   Family Communication: Mom updated at bedside.  Disposition: Status is: Inpatient Remains inpatient appropriate because: Continue treatment with anticoagulation therapy for PE.   Planned Discharge Destination: Home  Time spent: 50 minutes  Author: Vassie Loll, MD 02/03/2023 5:28  PM  For on call review [email or web address].

## 2023-02-03 NOTE — Progress Notes (Signed)
PHARMACY - ANTICOAGULATION CONSULT NOTE  Pharmacy Consult for heparin--> lovenox-> eliquis Indication: pulmonary embolus  No Known Allergies  Patient Measurements: Height: 6\' 2"  (188 cm) Weight: 127 kg (280 lb 0.1 oz) IBW/kg (Calculated) : 82.2 Heparin Dosing Weight: 110 kg  Vital Signs: Temp: 99.6 F (37.6 C) (11/10 0600) Temp Source: Oral (11/10 0600) BP: 113/76 (11/10 0600) Pulse Rate: 106 (11/09 2117)  Labs: Recent Labs    01/31/23 2103 02/01/23 0002 02/01/23 0327 02/01/23 0914 02/01/23 1736 02/02/23 0055 02/02/23 0754 02/03/23 0418  HGB 15.7  --  14.3  --   --  14.8  --  15.0  HCT 46.1  --  40.3  --   --  42.4  --  42.1  PLT 189  --  161  --   --  144*  --  174  HEPARINUNFRC  --   --   --    < > 0.81* 0.48 0.41  --   CREATININE 1.05  --   --   --   --  1.03  --   --   TROPONINIHS 2 2  --   --   --   --   --   --    < > = values in this interval not displayed.    Estimated Creatinine Clearance: 153.9 mL/min (by C-G formula based on SCr of 1.03 mg/dL).   Medical History: Past Medical History:  Diagnosis Date   Diarrhea    Gastritis    H. pylori infection    Has History of H Pylori   Vomiting    Assessment: 57 yoM presented to [facility] ED with complaints of chest pain. Pharmacy consulted to dose heparin for pulmonary embolism.  -CTA shows bilateral segmental and subsegmental emboli, no RHS -Hgb 15.7, plts 189, no prior anticoagulation   Transition to eliquis today. Concerns for safe discharge with PE and no PCP nor insurance. MD to d/w patient and family with options for PCP and treatment orally. Likely discharge with eliquis and see if patient will qualify for patient assistance through the manufacturer.   Goal of Therapy:  Heparin level 0.3-0.7 units/ml Monitor platelets by anticoagulation protocol: Yes   Plan:  D/C Lovenox Eliquis 10mg  po bid x 7 days, then 5mg  po bid Educate on eliquis Monitor for S/S of bleeding  Elder Cyphers, BS Pharm D,  BCPS Clinical Pharmacist 02/03/2023 7:45 AM

## 2023-02-03 NOTE — Plan of Care (Signed)

## 2023-02-04 LAB — CBC
HCT: 39.4 % (ref 39.0–52.0)
Hemoglobin: 14 g/dL (ref 13.0–17.0)
MCH: 31.7 pg (ref 26.0–34.0)
MCHC: 35.5 g/dL (ref 30.0–36.0)
MCV: 89.1 fL (ref 80.0–100.0)
Platelets: 208 10*3/uL (ref 150–400)
RBC: 4.42 MIL/uL (ref 4.22–5.81)
RDW: 11.7 % (ref 11.5–15.5)
WBC: 10.2 10*3/uL (ref 4.0–10.5)
nRBC: 0 % (ref 0.0–0.2)

## 2023-02-04 MED ORDER — APIXABAN 5 MG PO TABS: ORAL_TABLET | ORAL | 0 refills | Status: AC

## 2023-02-04 MED ORDER — ACETAMINOPHEN 500 MG PO TABS: 1000.0000 mg | ORAL_TABLET | Freq: Three times a day (TID) | ORAL | Status: AC | PRN

## 2023-02-04 MED ORDER — PANTOPRAZOLE SODIUM 40 MG PO TBEC: 40.0000 mg | DELAYED_RELEASE_TABLET | Freq: Every day | ORAL | 1 refills | Status: AC

## 2023-02-04 MED ORDER — OXYCODONE HCL 5 MG PO TABS: 5.0000 mg | ORAL_TABLET | Freq: Four times a day (QID) | ORAL | 0 refills | Status: AC | PRN

## 2023-02-04 NOTE — TOC Progression Note (Signed)
Transition of Care [facility]) - Progression Note    Patient Details  Name: Jonathan Gilbert MRN: [id number] Date of Birth: 12/15/1995  Transition of [facility]) CM/SW Contact  Karn Cassis, [place] Phone Number: 02/04/2023, 1:07 PM  Clinical Narrative:  Pt's mother is aware MATCH voucher can only be used at [employer]. No other needs reported.      Expected Discharge Plan: Home/Self Care Barriers to Discharge: Continued Medical Work up  Expected Discharge Plan and Services In-house Referral: Clinical Social Work Discharge Planning Services: [facility]                                           Social Determinants of Health (SDOH) Interventions SDOH Screenings   Food Insecurity: No Food Insecurity (02/01/2023)  Housing: Low Risk  (02/01/2023)  Transportation Needs: No Transportation Needs (02/01/2023)  Utilities: Not At Risk (02/01/2023)  Tobacco Use: Low Risk  (01/31/2023)    Readmission Risk Interventions     No data to display

## 2023-02-04 NOTE — Plan of Care (Signed)
Rested well during the shift. At the beginning of the shift, patient ambulated in the hallway. Medicated once during the shift with oxycodone for pain and once with iv dilaudid.  Problem: Education: Goal: Knowledge of General Education information will improve Description: Including pain rating scale, medication(s)/side effects and non-pharmacologic comfort measures Outcome: Progressing   Problem: Health Behavior/Discharge Planning: Goal: Ability to manage health-related needs will improve Outcome: Progressing   Problem: Clinical Measurements: Goal: Ability to maintain clinical measurements within normal limits will improve Outcome: Progressing Goal: Will remain free from infection Outcome: Progressing Goal: Diagnostic test results will improve Outcome: Progressing Goal: Respiratory complications will improve Outcome: Progressing Goal: Cardiovascular complication will be avoided Outcome: Progressing   Problem: Activity: Goal: Risk for activity intolerance will decrease Outcome: Progressing   Problem: Nutrition: Goal: Adequate nutrition will be maintained Outcome: Progressing   Problem: Coping: Goal: Level of anxiety will decrease Outcome: Progressing   Problem: Elimination: Goal: Will not experience complications related to bowel motility Outcome: Progressing Goal: Will not experience complications related to urinary retention Outcome: Progressing   Problem: Pain Management: Goal: General experience of comfort will improve Outcome: Progressing   Problem: Safety: Goal: Ability to remain free from injury will improve Outcome: Progressing   Problem: Skin Integrity: Goal: Risk for impaired skin integrity will decrease Outcome: Progressing

## 2023-02-04 NOTE — Discharge Summary (Signed)
Physician Discharge Summary   Patient: Jonathan Gilbert MRN: [id number] DOB: [birth year]  Admit date:     01/31/2023  Discharge date: 02/04/23  Discharge Physician: Vassie Loll   PCP: Patient, No Pcp Per   Recommendations at discharge:  Repeat CBC to follow hemoglobin trend/stability Repeat basic metabolic panel to follow electrolytes and renal function. After 71-month treatment with anticoagulation patient will require holiday from blood thinners to further examine coagulation profile and determine the need for long live anticoagulation therapy.  If needed arrange outpatient follow-up with hematology service.  Discharge Diagnoses: Principal Problem:   Acute pulmonary embolism (HCC) Active Problems:   Gastritis   Class 2 obesity  Brief Hospital admission course: Jonathan Gilbert is a 27 y.o. male with medical history significant of gastroesophageal flux disease/gastritis and marijuana use; who presented to the hospital secondary to chest pain.  Pain has been present for the last 3 days now, pleuritic in nature in the middle of his chest around clavicle area and radiating toward his back left more than right.  Pain is worse with any movement or deep breath.  No fever, no productive coughing spells, no sick contacts, no nausea vomiting.  Patient also expressed no dysuria, hematuria, melena, hematochezia, abdominal pain, focal weaknesses or any other complaints.   On discussion patient expressed history of family members with blood clots.  He denies any recent orthopedic surgery, IV drug use, lack of mobility and any recent vaccinations.    Workup in the ED demonstrating positive pulmonary embolism bilaterally segmental and subsegmental in disposition.  Patient started on heparin and analgesics provided.  Stable vital signs and good saturation on room air appreciated.  TRH contacted to place patient in the hospital for further evaluation and management.  Assessment and Plan: Acute  pulmonary embolism -Unprovoked -Patient with family members that have history of blood clots -2D echo demonstrating preserved ejection fraction and no heart strain -Lower extremity Dopplers without presence of DVT -Anticoagulation therapy will be transition to Eliquis -Given complaints of bloody sputum and chest x-ray will be ordered. -Good saturation appreciated on exam; still complaining of intermittent pleuritic chest pain.   -Continue as needed analgesics  -Outpatient follow-up with hematology service recommended; patient will require coagulation panel evaluation to rule out any deficiency (given family history and unprovoked event).   Class II obesity -Low-calorie diet, portion control and increase physical activity discussed with patient. -Body mass index is 35.95 kg/m.    History of gastritis/GERD -Continue PPI -Lifestyle modification discussed with patient.   Marijuana use: Cessation counseling provided.  Consultants: None Procedures performed: See below for x-ray reports. Disposition: Home Diet recommendation: Low calorie diet.  DISCHARGE MEDICATION: Allergies as of 02/04/2023   No Known Allergies      Medication List     STOP taking these medications    cyclobenzaprine 10 MG tablet Commonly known as: FLEXERIL   esomeprazole 40 MG capsule Commonly known as: NEXIUM   ibuprofen 600 MG tablet Commonly known as: ADVIL       TAKE these medications    acetaminophen 500 MG tablet Commonly known as: TYLENOL Take 2 tablets (1,000 mg total) by mouth every 8 (eight) hours as needed for mild pain (pain score 1-3) (or Fever >/= 101).   apixaban 5 MG Tabs tablet Commonly known as: ELIQUIS Take 2 tablets (10 mg total) by mouth 2 (two) times daily for 7 days, THEN 1 tablet (5 mg total) 2 (two) times daily. Start taking on: February 04, 2023   desloratadine 5 MG tablet Commonly known as: CLARINEX Take 5 mg by mouth daily.   oxyCODONE 5 MG immediate release  tablet Commonly known as: Oxy IR/ROXICODONE Take 1-2 tablets (5-10 mg total) by mouth every 6 (six) hours as needed for moderate pain (pain score 4-6).   pantoprazole 40 MG tablet Commonly known as: PROTONIX Take 1 tablet (40 mg total) by mouth daily. Start taking on: February 05, 2023        Follow-up Information     [facility] Follow up.   Why: you have an appointment to establish primary care on November 27 at 130pm.  Please speak with the clinic about applying for financial assistance Contact information: [place] [id number] [phone]               Discharge Exam: [name] Weights   01/31/23 2028 02/01/23 0737  Weight: 127 kg 127 kg   General exam: Alert, awake, oriented x 3; still experiencing intermittent pleuritic pain.  And having sound mild productive cough (bloody sputum appreciated).  No nausea, no vomiting. Respiratory system: Positive scattered rhonchi, no wheezing, no crackles. Cardiovascular system: Rate controlled, no rubs, no gallops, no JVD. Gastrointestinal system: Abdomen is obese, nondistended, soft and nontender. No organomegaly or masses felt. Normal bowel sounds heard. Central nervous system: Alert and oriented. No focal neurological deficits. Extremities: No cyanosis or clubbing. Skin: No petechiae. Psychiatry: Judgement and insight appear normal. Mood & affect appropriate.   Condition at discharge: Stable and improved.  The results of significant diagnostics from this hospitalization (including imaging, microbiology, ancillary and laboratory) are listed below for reference.   Imaging Studies: DG Chest 2 View  Result Date: 02/03/2023 CLINICAL DATA:  Cough. EXAM: CHEST - 2 VIEW COMPARISON:  01/31/2023 FINDINGS: Heart size and mediastinal contours are normal. Lung volumes are low and there is bibasilar atelectasis. Retrocardiac opacification is identified which may  reflect atelectasis or airspace disease. Visualized osseous structures are unremarkable. There is gaseous distension of the visualized bowel loops within the upper abdomen. IMPRESSION: 1. Low lung volumes with bibasilar atelectasis. 2. Retrocardiac opacification may reflect atelectasis or airspace disease. 3. Gaseous distension of the visualized bowel loops within the upper abdomen. Electronically Signed   By: Signa Kell M.D.   On: 02/03/2023 15:23   ECHOCARDIOGRAM COMPLETE  Result Date: 02/01/2023    ECHOCARDIOGRAM REPORT   Patient Name:   Jamerson Lebovits Gilbert Date of Exam: 02/01/2023 Medical Rec #:  [id number]           Height:       74.0 in Accession #:    [id number]          Weight:       280.0 lb Date of Birth:  07/07/1995           BSA:          2.508 m Patient Age:    27 years            BP:           147/92 mmHg Patient Gender: M                   HR:           92 bpm. [facility] Procedure: 2D Echo, Cardiac Doppler, Color Doppler and Intracardiac            Opacification Agent Indications:    Pulmonary  Embolus I26.09  History:        Patient has no prior history of Echocardiogram examinations.                 Acute pulmonary embolism (HCC).  Sonographer:    Celesta Gentile RCS Referring Phys: [id number] ASIA B ZIERLE-GHOSH IMPRESSIONS  1. Left ventricular ejection fraction, by estimation, is 65 to 70%. The left ventricle has normal function. The left ventricle has no regional wall motion abnormalities. There is mild left ventricular hypertrophy. Left ventricular diastolic parameters were normal.  2. Right ventricular systolic function is normal. The right ventricular size is normal.  3. The mitral valve is normal in structure. No evidence of mitral valve regurgitation. No evidence of mitral stenosis.  4. The aortic valve is tricuspid. Aortic valve regurgitation is not visualized. No aortic stenosis is present. FINDINGS  Left Ventricle: Left ventricular ejection fraction, by estimation, is 65  to 70%. The left ventricle has normal function. The left ventricle has no regional wall motion abnormalities. Definity contrast agent was given IV to delineate the left ventricular  endocardial borders. The left ventricular internal cavity size was normal in size. There is mild left ventricular hypertrophy. Left ventricular diastolic parameters were normal. Right Ventricle: The right ventricular size is normal. Right vetricular wall thickness was not well visualized. Right ventricular systolic function is normal. Left Atrium: Left atrial size was normal in size. Right Atrium: Right atrial size was normal in size. Pericardium: There is no evidence of pericardial effusion. Mitral Valve: The mitral valve is normal in structure. No evidence of mitral valve regurgitation. No evidence of mitral valve stenosis. Tricuspid Valve: The tricuspid valve is normal in structure. Tricuspid valve regurgitation is not demonstrated. No evidence of tricuspid stenosis. Aortic Valve: The aortic valve is tricuspid. Aortic valve regurgitation is not visualized. No aortic stenosis is present. Aortic valve mean gradient measures 6.0 mmHg. Aortic valve peak gradient measures 12.0 mmHg. Aortic valve area, by VTI measures 3.19  cm. Pulmonic Valve: The pulmonic valve was not well visualized. Pulmonic valve regurgitation is not visualized. No evidence of pulmonic stenosis. Aorta: The aortic root is normal in size and structure. IAS/Shunts: No atrial level shunt detected by color flow Doppler.  LEFT VENTRICLE PLAX 2D LVIDd:         4.90 cm   Diastology LVIDs:         3.10 cm   LV e' medial:    9.79 cm/s LV PW:         1.10 cm   LV E/e' medial:  9.6 LV IVS:        1.20 cm   LV e' lateral:   15.30 cm/s LVOT diam:     2.20 cm   LV E/e' lateral: 6.1 LV SV:         95 LV SV Index:   38 LVOT Area:     3.80 cm  RIGHT VENTRICLE RV S prime:     14.80 cm/s TAPSE (M-mode): 2.6 cm LEFT ATRIUM             Index        RIGHT ATRIUM           Index LA diam:         4.40 cm 1.75 cm/m   RA Area:     20.00 cm LA Vol (A2C):   74.8 ml 29.83 ml/m  RA Volume:   57.60 ml  22.97 ml/m LA Vol (A4C):   75.3 ml  30.03 ml/m LA Biplane Vol: 78.2 ml 31.18 ml/m  AORTIC VALVE AV Area (Vmax):    2.97 cm AV Area (Vmean):   3.31 cm AV Area (VTI):     3.19 cm AV Vmax:           173.00 cm/s AV Vmean:          105.000 cm/s AV VTI:            0.298 m AV Peak Grad:      12.0 mmHg AV Mean Grad:      6.0 mmHg LVOT Vmax:         135.00 cm/s LVOT Vmean:        91.500 cm/s LVOT VTI:          0.250 m LVOT/AV VTI ratio: 0.84  AORTA Ao Root diam: 3.20 cm MITRAL VALVE MV Area (PHT): 3.31 cm    SHUNTS MV Decel Time: 229 msec    Systemic VTI:  0.25 m MV E velocity: 94.00 cm/s  Systemic Diam: 2.20 cm MV A velocity: 63.30 cm/s MV E/A ratio:  1.48 Dina Rich MD Electronically signed by Dina Rich MD Signature Date/Time: 02/01/2023/4:45:34 PM    Final    US Venous Img Lower Bilateral (DVT)  Result Date: 02/01/2023 CLINICAL DATA:  Pulmonary embolism.  Evaluate for DVT. EXAM: BILATERAL LOWER EXTREMITY VENOUS DOPPLER ULTRASOUND TECHNIQUE: Gray-scale sonography with graded compression, as well as color Doppler and duplex ultrasound were performed to evaluate the lower extremity deep venous systems from the level of the common femoral vein and including the common femoral, femoral, profunda femoral, popliteal and calf veins including the posterior tibial, peroneal and gastrocnemius veins when visible. The superficial great saphenous vein was also interrogated. Spectral Doppler was utilized to evaluate flow at rest and with distal augmentation maneuvers in the common femoral, femoral and popliteal veins. COMPARISON:  None Available. FINDINGS: RIGHT LOWER EXTREMITY Common Femoral Vein: No evidence of thrombus. Normal compressibility, respiratory phasicity and response to augmentation. Saphenofemoral Junction: No evidence of thrombus. Normal compressibility and flow on color Doppler imaging.  Profunda Femoral Vein: No evidence of thrombus. Normal compressibility and flow on color Doppler imaging. Femoral Vein: No evidence of thrombus. Normal compressibility, respiratory phasicity and response to augmentation. Popliteal Vein: No evidence of thrombus. Normal compressibility, respiratory phasicity and response to augmentation. Calf Veins: No evidence of thrombus. Normal compressibility and flow on color Doppler imaging. Superficial Great Saphenous Vein: No evidence of thrombus. Normal compressibility. Other Findings:  None. LEFT LOWER EXTREMITY Common Femoral Vein: No evidence of thrombus. Normal compressibility, respiratory phasicity and response to augmentation. Saphenofemoral Junction: No evidence of thrombus. Normal compressibility and flow on color Doppler imaging. Profunda Femoral Vein: No evidence of thrombus. Normal compressibility and flow on color Doppler imaging. Femoral Vein: No evidence of thrombus. Normal compressibility, respiratory phasicity and response to augmentation. Popliteal Vein: No evidence of thrombus. Normal compressibility, respiratory phasicity and response to augmentation. Calf Veins: No evidence of thrombus. Normal compressibility and flow on color Doppler imaging. Superficial Great Saphenous Vein: No evidence of thrombus. Normal compressibility. Other Findings:  None. IMPRESSION: No evidence of deep venous thrombosis in either lower extremity. Electronically Signed   By: Simonne Come M.D.   On: 02/01/2023 09:44   CT Angio Chest PE W and/or Wo Contrast  Result Date: 02/01/2023 CLINICAL DATA:  Pulmonary embolism suspected, high probability. Chest pain for 2 days. EXAM: CT ANGIOGRAPHY CHEST WITH CONTRAST TECHNIQUE: Multidetector CT imaging of the chest was performed using the  standard protocol during bolus administration of intravenous contrast. Multiplanar CT image reconstructions and MIPs were obtained to evaluate the vascular anatomy. RADIATION DOSE REDUCTION: This exam was  performed according to the departmental dose-optimization program which includes automated exposure control, adjustment of the mA and/or kV according to patient size and/or use of iterative reconstruction technique. CONTRAST:  OMNIPAQUE IOHEXOL 350 MG/ML SOLN COMPARISON:  None Available. FINDINGS: Cardiovascular: The heart is normal in size and there is no pericardial effusion. The aorta and pulmonary trunk are normal in caliber. Pulmonary artery filling defects are noted in the segmental and subsegmental pulmonary arteries in the lower lobes bilaterally. Evaluation of the pulmonary arteries is limited due to respiratory motion artifact. No evidence of right heart strain. Mediastinum/Nodes: No mediastinal, hilar, or axillary lymphadenopathy. The thyroid gland, trachea, and esophagus are within normal limits. Lungs/Pleura: Patchy airspace disease is noted in the lower lobes bilaterally, possible atelectasis, infiltrates, or pulmonary infarcts. No effusion or pneumothorax. Upper Abdomen: No acute abnormality. Musculoskeletal: No acute osseous abnormality. Review of the MIP images confirms the above findings. IMPRESSION: 1. Bilateral segmental and subsegmental pulmonary emboli. No evidence of right heart strain. 2. Patchy airspace disease at the lung bases, possible atelectasis, infiltrate, or pulmonary infarcts. Critical Value/emergent results were called by telephone at the time of interpretation on 02/01/2023 at 1:45 am to provider Geoffery Lyons , who verbally acknowledged these results. Electronically Signed   By: Thornell Sartorius M.D.   On: 02/01/2023 01:47   DG Chest 2 View  Result Date: 01/31/2023 CLINICAL DATA:  Chest pain and discomfort for 2 days, left arm numbness EXAM: CHEST - 2 VIEW COMPARISON:  None Available. FINDINGS: Frontal and lateral views of the chest demonstrate an unremarkable cardiac silhouette. Lung volumes are diminished, with crowding the pulmonary vasculature. Patchy consolidation at  the lung bases favors atelectasis. No effusion or pneumothorax. No acute bony abnormalities. IMPRESSION: 1. Low lung volumes, with crowding of the pulmonary vasculature and patchy bibasilar consolidation consistent with atelectasis. Electronically Signed   By: Sharlet Salina M.D.   On: 01/31/2023 22:38    Microbiology: Results for orders placed or performed during the hospital encounter of 06/11/20  Group A Strep by PCR     Status: Abnormal   Collection Time: 06/11/20 11:18 PM   Specimen: Throat; Sterile Swab  Result Value Ref Range Status   Group A Strep by PCR DETECTED (A) NOT DETECTED Final    Comment: Performed at [facility], [address] [place], [place] [address]    Labs: CBC: Recent Labs  Lab 01/31/23 2103 02/01/23 0327 02/02/23 0055 02/03/23 0418 02/04/23 0421  WBC 10.7* 11.1* 13.0* 11.1* 10.2  HGB 15.7 14.3 14.8 15.0 14.0  HCT 46.1 40.3 42.4 42.1 39.4  MCV 91.5 90.6 90.2 89.8 89.1  PLT 189 161 144* 174 208   Basic Metabolic Panel: Recent Labs  Lab 01/31/23 2103 02/01/23 0914 02/02/23 0055  NA 137  --  136  K 4.1  --  4.0  CL 103  --  103  CO2 23  --  24  GLUCOSE 98  --  107*  BUN 9  --  10  CREATININE 1.05  --  1.03  CALCIUM 9.1  --  8.9  MG  --  2.1  --    Liver Function Tests: Recent Labs  Lab 02/01/23 0914  AST 414*  ALT 185*  ALKPHOS 88  BILITOT 2.3*  PROT 7.6  ALBUMIN 4.4   CBG: No results  for input(s): "GLUCAP" in the last 168 hours.  Discharge time spent: greater than 30 minutes.  Signed: Vassie Loll, MD [facility] 02/04/2023

## 2023-02-04 NOTE — Plan of Care (Signed)

## 2023-02-19 ENCOUNTER — Telehealth (INDEPENDENT_AMBULATORY_CARE_PROVIDER_SITE_OTHER): Payer: Self-pay | Admitting: Primary Care

## 2023-02-19 NOTE — Telephone Encounter (Signed)
Called pt to remind them of apt. Pt phone was unavailable.

## 2023-02-20 ENCOUNTER — Inpatient Hospital Stay (INDEPENDENT_AMBULATORY_CARE_PROVIDER_SITE_OTHER): Payer: Self-pay | Admitting: Primary Care
